# Patient Record
Sex: Female | Born: 1965 | Race: White | Hispanic: No | Marital: Married | Smoking: Former smoker
Health system: Southern US, Community
[De-identification: ages and names within clinical notes are randomized; demographics above are authoritative.]

## PROBLEM LIST (undated history)

## (undated) DIAGNOSIS — N8300 Follicular cyst of ovary, unspecified side: Secondary | ICD-10-CM

## (undated) DIAGNOSIS — E039 Hypothyroidism, unspecified: Secondary | ICD-10-CM

## (undated) DIAGNOSIS — Z860100 Personal history of colon polyps, unspecified: Secondary | ICD-10-CM

## (undated) DIAGNOSIS — E119 Type 2 diabetes mellitus without complications: Secondary | ICD-10-CM

## (undated) DIAGNOSIS — F419 Anxiety disorder, unspecified: Secondary | ICD-10-CM

## (undated) DIAGNOSIS — K222 Esophageal obstruction: Secondary | ICD-10-CM

## (undated) DIAGNOSIS — K602 Anal fissure, unspecified: Secondary | ICD-10-CM

## (undated) DIAGNOSIS — Z9889 Other specified postprocedural states: Secondary | ICD-10-CM

## (undated) DIAGNOSIS — E669 Obesity, unspecified: Secondary | ICD-10-CM

## (undated) DIAGNOSIS — K209 Esophagitis, unspecified without bleeding: Secondary | ICD-10-CM

## (undated) DIAGNOSIS — Z800 Family history of malignant neoplasm of digestive organs: Secondary | ICD-10-CM

## (undated) DIAGNOSIS — T7840XA Allergy, unspecified, initial encounter: Secondary | ICD-10-CM

## (undated) DIAGNOSIS — R1013 Epigastric pain: Secondary | ICD-10-CM

## (undated) DIAGNOSIS — K760 Fatty (change of) liver, not elsewhere classified: Secondary | ICD-10-CM

## (undated) DIAGNOSIS — K219 Gastro-esophageal reflux disease without esophagitis: Secondary | ICD-10-CM

## (undated) DIAGNOSIS — K2990 Gastroduodenitis, unspecified, without bleeding: Secondary | ICD-10-CM

## (undated) DIAGNOSIS — K589 Irritable bowel syndrome without diarrhea: Secondary | ICD-10-CM

## (undated) DIAGNOSIS — K2970 Gastritis, unspecified, without bleeding: Secondary | ICD-10-CM

## (undated) DIAGNOSIS — R112 Nausea with vomiting, unspecified: Secondary | ICD-10-CM

## (undated) DIAGNOSIS — D649 Anemia, unspecified: Secondary | ICD-10-CM

## (undated) DIAGNOSIS — E785 Hyperlipidemia, unspecified: Secondary | ICD-10-CM

## (undated) HISTORY — DX: Obesity, unspecified: E66.9

## (undated) HISTORY — DX: Irritable bowel syndrome, unspecified: K58.9

## (undated) HISTORY — DX: Anxiety disorder, unspecified: F41.9

## (undated) HISTORY — DX: Fatty (change of) liver, not elsewhere classified: K76.0

## (undated) HISTORY — PX: UPPER GASTROINTESTINAL ENDOSCOPY: SHX188

## (undated) HISTORY — DX: Personal history of colon polyps, unspecified: Z86.0100

## (undated) HISTORY — DX: Personal history of colonic polyps: Z86.010

## (undated) HISTORY — DX: Esophagitis, unspecified without bleeding: K20.90

## (undated) HISTORY — DX: Type 2 diabetes mellitus without complications: E11.9

## (undated) HISTORY — DX: Anal fissure, unspecified: K60.2

## (undated) HISTORY — DX: Anemia, unspecified: D64.9

## (undated) HISTORY — DX: Hypothyroidism, unspecified: E03.9

## (undated) HISTORY — DX: Gastro-esophageal reflux disease without esophagitis: K21.9

## (undated) HISTORY — DX: Gastritis, unspecified, without bleeding: K29.70

## (undated) HISTORY — DX: Follicular cyst of ovary, unspecified side: N83.00

## (undated) HISTORY — DX: Family history of malignant neoplasm of digestive organs: Z80.0

## (undated) HISTORY — PX: ABDOMINAL HYSTERECTOMY: SHX81

## (undated) HISTORY — DX: Esophageal obstruction: K22.2

## (undated) HISTORY — DX: Allergy, unspecified, initial encounter: T78.40XA

## (undated) HISTORY — DX: Gastroduodenitis, unspecified, without bleeding: K29.90

## (undated) HISTORY — DX: Hyperlipidemia, unspecified: E78.5

## (undated) HISTORY — DX: Epigastric pain: R10.13

## (undated) HISTORY — DX: Esophagitis, unspecified: K20.9

## (undated) HISTORY — PX: COLONOSCOPY: SHX174

---

## 1992-10-06 HISTORY — PX: TONSILLECTOMY: SUR1361

## 1998-12-05 ENCOUNTER — Other Ambulatory Visit: Admission: RE | Admit: 1998-12-05 | Discharge: 1998-12-05 | Payer: Self-pay | Admitting: Obstetrics and Gynecology

## 1999-01-02 ENCOUNTER — Ambulatory Visit (HOSPITAL_COMMUNITY): Admission: RE | Admit: 1999-01-02 | Discharge: 1999-01-02 | Payer: Self-pay | Admitting: Obstetrics and Gynecology

## 1999-03-09 ENCOUNTER — Ambulatory Visit (HOSPITAL_COMMUNITY): Admission: RE | Admit: 1999-03-09 | Discharge: 1999-03-09 | Payer: Self-pay | Admitting: Obstetrics and Gynecology

## 2000-02-05 ENCOUNTER — Other Ambulatory Visit: Admission: RE | Admit: 2000-02-05 | Discharge: 2000-02-05 | Payer: Self-pay | Admitting: *Deleted

## 2000-04-10 ENCOUNTER — Other Ambulatory Visit: Admission: RE | Admit: 2000-04-10 | Discharge: 2000-04-10 | Payer: Self-pay | Admitting: *Deleted

## 2000-04-10 ENCOUNTER — Encounter (INDEPENDENT_AMBULATORY_CARE_PROVIDER_SITE_OTHER): Payer: Self-pay | Admitting: Specialist

## 2000-10-06 HISTORY — PX: OVARIAN CYST REMOVAL: SHX89

## 2001-02-24 ENCOUNTER — Encounter (INDEPENDENT_AMBULATORY_CARE_PROVIDER_SITE_OTHER): Payer: Self-pay | Admitting: Specialist

## 2001-02-24 ENCOUNTER — Encounter: Payer: Self-pay | Admitting: *Deleted

## 2001-02-24 ENCOUNTER — Observation Stay (HOSPITAL_COMMUNITY): Admission: RE | Admit: 2001-02-24 | Discharge: 2001-02-25 | Payer: Self-pay | Admitting: *Deleted

## 2002-11-23 ENCOUNTER — Other Ambulatory Visit: Admission: RE | Admit: 2002-11-23 | Discharge: 2002-11-23 | Payer: Self-pay | Admitting: *Deleted

## 2003-12-26 ENCOUNTER — Other Ambulatory Visit: Admission: RE | Admit: 2003-12-26 | Discharge: 2003-12-26 | Payer: Self-pay | Admitting: *Deleted

## 2004-05-30 ENCOUNTER — Emergency Department (HOSPITAL_COMMUNITY): Admission: EM | Admit: 2004-05-30 | Discharge: 2004-05-30 | Payer: Self-pay | Admitting: Emergency Medicine

## 2005-03-17 ENCOUNTER — Other Ambulatory Visit: Admission: RE | Admit: 2005-03-17 | Discharge: 2005-03-17 | Payer: Self-pay | Admitting: *Deleted

## 2006-07-11 ENCOUNTER — Emergency Department (HOSPITAL_COMMUNITY): Admission: EM | Admit: 2006-07-11 | Discharge: 2006-07-11 | Payer: Self-pay | Admitting: Emergency Medicine

## 2006-08-10 ENCOUNTER — Other Ambulatory Visit: Admission: RE | Admit: 2006-08-10 | Discharge: 2006-08-10 | Payer: Self-pay | Admitting: *Deleted

## 2007-01-05 ENCOUNTER — Ambulatory Visit: Payer: Self-pay | Admitting: Gastroenterology

## 2007-01-18 ENCOUNTER — Ambulatory Visit: Payer: Self-pay | Admitting: Gastroenterology

## 2007-08-18 ENCOUNTER — Other Ambulatory Visit: Admission: RE | Admit: 2007-08-18 | Discharge: 2007-08-18 | Payer: Self-pay | Admitting: Gynecology

## 2008-02-06 ENCOUNTER — Emergency Department (HOSPITAL_COMMUNITY): Admission: EM | Admit: 2008-02-06 | Discharge: 2008-02-06 | Payer: Self-pay | Admitting: Emergency Medicine

## 2008-07-26 ENCOUNTER — Ambulatory Visit (HOSPITAL_COMMUNITY): Admission: RE | Admit: 2008-07-26 | Discharge: 2008-07-26 | Payer: Self-pay | Admitting: Family Medicine

## 2008-08-03 ENCOUNTER — Encounter (INDEPENDENT_AMBULATORY_CARE_PROVIDER_SITE_OTHER): Payer: Self-pay | Admitting: *Deleted

## 2008-11-09 DIAGNOSIS — E039 Hypothyroidism, unspecified: Secondary | ICD-10-CM | POA: Insufficient documentation

## 2008-11-09 DIAGNOSIS — K7689 Other specified diseases of liver: Secondary | ICD-10-CM | POA: Insufficient documentation

## 2008-11-09 DIAGNOSIS — K589 Irritable bowel syndrome without diarrhea: Secondary | ICD-10-CM | POA: Insufficient documentation

## 2008-11-09 DIAGNOSIS — N83209 Unspecified ovarian cyst, unspecified side: Secondary | ICD-10-CM | POA: Insufficient documentation

## 2008-11-09 DIAGNOSIS — Z860100 Personal history of colon polyps, unspecified: Secondary | ICD-10-CM | POA: Insufficient documentation

## 2008-11-09 DIAGNOSIS — K649 Unspecified hemorrhoids: Secondary | ICD-10-CM | POA: Insufficient documentation

## 2008-11-09 DIAGNOSIS — K219 Gastro-esophageal reflux disease without esophagitis: Secondary | ICD-10-CM | POA: Insufficient documentation

## 2008-12-07 ENCOUNTER — Ambulatory Visit: Payer: Self-pay | Admitting: Gastroenterology

## 2008-12-07 DIAGNOSIS — E669 Obesity, unspecified: Secondary | ICD-10-CM | POA: Insufficient documentation

## 2008-12-07 DIAGNOSIS — R1013 Epigastric pain: Secondary | ICD-10-CM | POA: Insufficient documentation

## 2008-12-08 ENCOUNTER — Ambulatory Visit: Payer: Self-pay | Admitting: Gastroenterology

## 2008-12-08 DIAGNOSIS — K2970 Gastritis, unspecified, without bleeding: Secondary | ICD-10-CM | POA: Insufficient documentation

## 2008-12-08 DIAGNOSIS — K2990 Gastroduodenitis, unspecified, without bleeding: Secondary | ICD-10-CM

## 2008-12-14 ENCOUNTER — Telehealth: Payer: Self-pay | Admitting: Gastroenterology

## 2009-01-22 DIAGNOSIS — K209 Esophagitis, unspecified without bleeding: Secondary | ICD-10-CM | POA: Insufficient documentation

## 2009-01-23 ENCOUNTER — Ambulatory Visit: Payer: Self-pay | Admitting: Gastroenterology

## 2009-01-23 LAB — CONVERTED CEMR LAB
ALT: 49 units/L — ABNORMAL HIGH (ref 0–35)
AST: 48 units/L — ABNORMAL HIGH (ref 0–37)
Ceruloplasmin: 38 mg/dL (ref 21–63)
HCV Ab: NEGATIVE
HDL: 33.2 mg/dL — ABNORMAL LOW (ref >39.00)
Hepatitis B Surface Ag: NEGATIVE
INR: 1 (ref 0.8–1.0)
Prothrombin Time: 10.8 s — ABNORMAL LOW (ref 10.9–13.3)
Total Bilirubin: 0.7 mg/dL (ref 0.3–1.2)
Total Protein: 7.8 g/dL (ref 6.0–8.3)
Triglycerides: 108 mg/dL (ref 0.0–149.0)

## 2009-01-31 ENCOUNTER — Telehealth: Payer: Self-pay | Admitting: Gastroenterology

## 2009-02-22 ENCOUNTER — Telehealth: Payer: Self-pay | Admitting: Gastroenterology

## 2009-02-22 ENCOUNTER — Encounter: Admission: RE | Admit: 2009-02-22 | Discharge: 2009-02-22 | Payer: Self-pay | Admitting: Gastroenterology

## 2009-02-22 ENCOUNTER — Encounter: Payer: Self-pay | Admitting: Gastroenterology

## 2009-03-06 ENCOUNTER — Ambulatory Visit: Payer: Self-pay | Admitting: Gastroenterology

## 2009-03-06 DIAGNOSIS — M2550 Pain in unspecified joint: Secondary | ICD-10-CM | POA: Insufficient documentation

## 2009-03-07 LAB — CONVERTED CEMR LAB
ALT: 54 units/L — ABNORMAL HIGH (ref 0–35)
Alkaline Phosphatase: 73 units/L (ref 39–117)
Total Bilirubin: 1 mg/dL (ref 0.3–1.2)

## 2009-06-01 ENCOUNTER — Telehealth (INDEPENDENT_AMBULATORY_CARE_PROVIDER_SITE_OTHER): Payer: Self-pay | Admitting: *Deleted

## 2009-06-04 ENCOUNTER — Ambulatory Visit: Payer: Self-pay | Admitting: Gastroenterology

## 2009-06-04 LAB — CONVERTED CEMR LAB: ALT: 43 units/L — ABNORMAL HIGH (ref 0–35)

## 2009-06-05 ENCOUNTER — Ambulatory Visit: Payer: Self-pay | Admitting: Gastroenterology

## 2009-09-05 ENCOUNTER — Encounter: Payer: Self-pay | Admitting: Gastroenterology

## 2009-09-25 ENCOUNTER — Telehealth: Payer: Self-pay | Admitting: Gastroenterology

## 2009-09-25 ENCOUNTER — Ambulatory Visit: Payer: Self-pay | Admitting: Gastroenterology

## 2009-10-01 LAB — CONVERTED CEMR LAB
ALT: 54 units/L — ABNORMAL HIGH (ref 0–35)
AST: 52 units/L — ABNORMAL HIGH (ref 0–37)
Bilirubin, Direct: 0.1 mg/dL (ref 0.0–0.3)
Total Protein: 7.2 g/dL (ref 6.0–8.3)

## 2009-10-02 ENCOUNTER — Ambulatory Visit: Payer: Self-pay | Admitting: Gastroenterology

## 2009-10-06 HISTORY — PX: CHOLECYSTECTOMY: SHX55

## 2009-10-09 ENCOUNTER — Ambulatory Visit: Payer: Self-pay | Admitting: Cardiology

## 2009-10-12 DIAGNOSIS — K439 Ventral hernia without obstruction or gangrene: Secondary | ICD-10-CM | POA: Insufficient documentation

## 2009-10-22 ENCOUNTER — Telehealth: Payer: Self-pay | Admitting: Gastroenterology

## 2009-11-05 ENCOUNTER — Ambulatory Visit (HOSPITAL_COMMUNITY): Admission: RE | Admit: 2009-11-05 | Discharge: 2009-11-05 | Payer: Self-pay | Admitting: General Surgery

## 2010-01-18 ENCOUNTER — Ambulatory Visit (HOSPITAL_COMMUNITY): Admission: RE | Admit: 2010-01-18 | Discharge: 2010-01-18 | Payer: Self-pay | Admitting: General Surgery

## 2010-01-18 ENCOUNTER — Encounter (INDEPENDENT_AMBULATORY_CARE_PROVIDER_SITE_OTHER): Payer: Self-pay | Admitting: *Deleted

## 2010-02-05 ENCOUNTER — Encounter: Payer: Self-pay | Admitting: Gastroenterology

## 2010-09-06 ENCOUNTER — Encounter: Payer: Self-pay | Admitting: Gastroenterology

## 2010-09-06 ENCOUNTER — Ambulatory Visit (HOSPITAL_COMMUNITY): Admission: RE | Admit: 2010-09-06 | Discharge: 2010-09-06 | Payer: Self-pay | Admitting: Family Medicine

## 2010-09-12 ENCOUNTER — Ambulatory Visit: Payer: Self-pay | Admitting: Gastroenterology

## 2010-10-27 ENCOUNTER — Encounter: Payer: Self-pay | Admitting: Endocrinology

## 2010-11-05 NOTE — Letter (Signed)
Summary: [facility]   Imported By: Sherian Rein 05/24/2010 10:35:20  _____________________________________________________________________  External Attachment:    Type:   Image     Comment:   External Document

## 2010-11-05 NOTE — Op Note (Signed)
Summary: Laparoscopic cholecystectomy & Ventral hernia repair, Pathology    NAME:  Lori Barber, Lori Barber              ACCOUNT NO.:  [id number]      MEDICAL RECORD NO.:  [id number]          PATIENT TYPE:  AMB      LOCATION:  DAY                          FACILITY:  [facility]      PHYSICIAN:  Ollen Gross. Vernell Morgans, M.D. DATE OF BIRTH:  1966-03-01      DATE OF PROCEDURE:  01/18/2010   DATE OF DISCHARGE:                                  OPERATIVE REPORT      PREOPERATIVE DIAGNOSIS:  Biliary dyskinesia.      POSTOPERATIVE DIAGNOSES:   1. Biliary dyskinesia.   2. Ventral hernia.      PROCEDURES:   1. Laparoscopic cholecystectomy with intraoperative cholangiogram.   2. Ventral hernia repair.      SURGEON:  Dr. Carolynne Edouard      ASSISTANT:  Dr. Michaell Cowing.      ANESTHESIA:  General endotracheal.      PROCEDURE:  After informed consent was obtained, the patient was brought   to the operating room, placed in supine position on the operating room   table.  After adequate induction of general anesthesia, the patient's   abdomen was prepped with Betadine and draped in usual sterile manner.   The area below the umbilicus was infiltrated with 0.25% Marcaine.  A   small incision was made with a 15-blade knife.  This incision was   carried down through the subcutaneous tissue bluntly with a hemostat and   Army-[employer]y retractors until the linea alba was identified.  The linea   alba was incised with a 15-blade knife and either side was grasped with   Kocher clamps and elevated anteriorly.  The preperitoneal space was then   probed bluntly with a hemostat until the peritoneum was opened and   access was gained to the abdominal cavity.  A 0 Vicryl pursestring   stitch was placed in the fascia around the opening.  A Hasson cannula   was placed through the opening and anchored in place with the previously   placed Vicryl pursestring stitch.  The abdomen was then insufflated with   carbon dioxide without difficulty.      The  patient was placed in reverse Trendelenburg position, rotated with   the right side up.  Next the epigastric region was infiltrated with   0.25% Marcaine.  A small incision was made with a 15-blade knife and a   10-mm port was placed bluntly through this incision into the abdominal   cavity under direct vision without any difficulty.  Sites were then   chosen laterally on the right side of the abdomen for placement of 5-mm   ports.  Each of these areas was infiltrated with 0.25% Marcaine.  Small   stab incisions were made with a 15-blade knife and 5-mm ports were   placed bluntly through these incisions into the abdominal cavity under   direct vision.      A blunt grasper was placed through the lateral-most 5-mm port, and used   to grasp the dome of  the gallbladder and elevate it anteriorly and   superiorly.  Another blunt grasper was placed through the other 5-mm   port and used to retract on the body and neck of the gallbladder.  A   dissector was placed through the epigastric port.  The peritoneal   reflection at the gallbladder neck was opened with the electrocautery.   Blunt dissection was then carried out in this area until the gallbladder   neck-cystic duct junction was readily identified and a good window was   created.  A single clip was placed on the gallbladder neck.  A small   ductotomy was made just below the clip with the laparoscopic scissors.   A 14 gauge Angiocath was placed percutaneously through the anterior   abdominal wall under direct vision.  A Reddick cholangiogram catheter   was placed through the Angiocath and flushed.  The Reddick catheter was   then placed within the cystic duct and anchored in place with a clip and   a cholangiogram was obtained that showed no filling defects, good   emptying into the duodenum and adequate length on the cystic duct.      The anchoring clip and catheter were removed from the patient.  Three   clips placed proximally on the  cystic duct and the duct was divided   between the 2 sets of clips.  Posterior to this, the cystic artery was   identified and again dissected bluntly in circumferential manner until a   good window was created.  Two clips were placed proximally and one   distally on the artery and the artery was divided between the two.  Next   a laparoscopic hook cautery device was used to separate the gallbladder   from the liver bed.  Prior to completely detaching the gallbladder from   the liver bed, the liver bed was inspected and several small bleeding   points were coagulated with electrocautery until the area was completely   hemostatic.  The gallbladder was then detached the rest of the way from   the liver bed without difficulty.  A laparoscopic bag was inserted   through the epigastric port.  The gallbladder was placed within the bag   and the bag was sealed.  The abdomen was then irrigated with copious   amounts of saline until the effluent was clear.  The laparoscope was   then moved to the epigastric port.  A gallbladder grasper was placed   through the Hasson cannula and used to grasp the upper end of the bag.   The bag with the gallbladder was removed with the Hasson cannula through   the infraumbilical port without difficulty.  The fascial defect was   closed with the previously-placed Vicryl pursestring stitch as well as   another figure-of-eight 0 Vicryl stitch.  There was a small ventral   hernia just below this closure that we were able to close with more   figure-of-eight and interrupted 0 Vicryl stitches through the same   incision.      Once this was accomplished, the liver bed was inspected again and   appeared to be hemostatic.  The ports were then removed under direct   vision.  The gas was allowed to escape.  The skin incisions were all   closed with interrupted 4-0 Monocryl subcuticular stitches.  Dermabond   dressings were then applied.  The patient tolerated the procedure  well.   At the end of the case all  needle, sponge and instrument counts were   correct.  The patient was then awakened and taken to the recovery in   stable condition.               Ollen Gross. Vernell Morgans, M.D.            PST/MEDQ  D:  01/18/2010  T:  01/18/2010  Job:  [id number]      Electronically Signed by Chevis Pretty III M.D. on 01/29/2010 05:31:38 PM     SP-Surgical Pathology - STATUS: Final  .                                         Perform Date: 15Apr11 00:01  Ordered By: Hoy Finlay MD , PROVIDER         Ordered Date:  Facility: [facility]                              Department: [facility]  Service Report Text  [facility]   [address], [address]   [place], [place] [address]   Telephone [phone] or [phone] Fax [phone]    REPORT OF SURGICAL PATHOLOGY    Case #: [id number]   Patient Name: Lori Barber, Lori Barber   Office Chart Number: [id number]    MRN: [id number]   Pathologist: Colonel Bald M.D., Ivin Booty   DOB/Age 03-03-1966 (Age: 10) Gender: F   Date Taken: 01/18/2010   Date Received: 01/18/2010    FINAL DIAGNOSIS   ***Microscopic Examination and Diagnosis***    1. GALLBLADDER, : - MILD CHRONIC CHOLECYSTITIS.   - there is no evidence of malignancy.    DATE REPORTED: 01/22/2010   *** Electronically Signed Out by Colonel Bald M.D., Ivin Booty, Pathologist, Electronic Signature ***    CLINICAL HISTORY    SPECIMEN(S) OBTAINED   1. Gallbladder,    SPECIMEN COMMENTS:   1. Biliary dyskinesia. (Lw)    Gross Description   1. Size/?Intact: 6 cm in length x 3.5 cm across the fundus, intact   Serosal surface: Greenish yellow, smooth and glistening.   Mucosa/Wall: The wall thickness measures up to 0.3 cm. The mucosa is pale tan   and velvety.   Contents: The gallbladder contains an abundant amount of brownish green, thick   viscus bile. No calculi are PRESENT in either the gallbladder or specimen   container.   Cystic duct: The cystic duct measures 0.3 cm in length x 0.2 cm in  diameter.   Block summary: Representative sections are submitted in a single block.   (Jc/Gp:Mz 01/18/10)    MICROSCOPIC DESCRIPTION    CASE COMMENTS   Additional Information  HL7 RESULT STATUS : F  External IF Update Timestamp : 2010-01-22:18:00:00.000000

## 2010-11-05 NOTE — Progress Notes (Signed)
Summary: rx refill   Phone Note Call from Patient Call back at Work Phone [phone]   Caller: Patient Call For: Jarold Motto Reason for Call: Refill Medication, Talk to Nurse Summary of Call: Patient needs refills for her Nexium called in to [facility] Initial call taken by: Tawni Levy,  October 22, 2009 10:39 AM    Prescriptions: NEXIUM 40 MG CPDR (ESOMEPRAZOLE MAGNESIUM) TAke 1 tablet daily 30 minutes before meals  #30 x 11   Entered by:   Ashok Cordia RN   Authorized by:   Mardella Layman MD [facility]   Signed by:   Ashok Cordia RN on 10/23/2009   Method used:   Electronically to        [facility] (retail)       [address] [place], [place]  [address]       Ph: [phone]       Fax: [phone]   RxID:   [id number]

## 2010-12-24 LAB — GLUCOSE, CAPILLARY
Glucose-Capillary: 105 mg/dL — ABNORMAL HIGH (ref 70–99)
Glucose-Capillary: 106 mg/dL — ABNORMAL HIGH (ref 70–99)

## 2010-12-25 LAB — COMPREHENSIVE METABOLIC PANEL
ALT: 49 U/L — ABNORMAL HIGH (ref 0–35)
Alkaline Phosphatase: 75 U/L (ref 39–117)
GFR calc Af Amer: >60 mL/min (ref >60)
Glucose, Bld: 113 mg/dL — ABNORMAL HIGH (ref 70–99)
Potassium: 3.9 mEq/L (ref 3.5–5.1)
Sodium: 139 mEq/L (ref 135–145)
Total Bilirubin: 0.7 mg/dL (ref 0.3–1.2)

## 2010-12-25 LAB — DIFFERENTIAL
Lymphocytes Relative: 27 % (ref 12–46)
Lymphs Abs: 2.2 10*3/uL (ref 0.7–4.0)
Monocytes Absolute: 0.5 10*3/uL (ref 0.1–1.0)
Monocytes Relative: 7 % (ref 3–12)
Neutrophils Relative %: 63 % (ref 43–77)

## 2010-12-25 LAB — CBC
HCT: 37.7 % (ref 36.0–46.0)
Hemoglobin: 12.9 g/dL (ref 12.0–15.0)
MCV: 91.5 fL (ref 78.0–100.0)
RBC: 4.12 MIL/uL (ref 3.87–5.11)

## 2010-12-30 ENCOUNTER — Other Ambulatory Visit: Payer: Self-pay | Admitting: Gynecology

## 2011-01-16 LAB — GLUCOSE, CAPILLARY
Glucose-Capillary: 85 mg/dL (ref 70–99)
Glucose-Capillary: 89 mg/dL (ref 70–99)

## 2011-02-21 NOTE — Discharge Summary (Signed)
[facility]  Patient:    Lori Barber, Lori Barber                     MRN: [id number] Adm. Date:  [id number] Disc. Date: [id number] Attending:  Collene Schlichter                           Discharge Summary  HISTORY:  The patient is a 45 year old with abnormal uterine bleeding, superior pelvic pain, history of adenomyosis, and endometriosis for hysterectomy on May 22.  The remainder of her history and physical are as previously dictated.  Preoperative hemoglobin 11.2, chest x-ray normal. Electrocardiogram normal.  HOSPITAL COURSE:  The patient was taken to the operating room on May 22, at which time an abdominal supracervical hysterectomy and lysis of adhesions were performed.  The patient did well postoperatively.  Diet and ambulation were progressed over the evening of May 22 and early morning of Feb 25, 2001.  On the morning of May 23, she was afebrile and experiencing no problems except pain which was controlled by medication, and it was felt that she could be discharged at this time.  FINAL DIAGNOSES: 1. Pelvic pain probably secondary to uterine lesions. 2. Question adenomyosis. 3. Abnormal uterine bleeding. 4. Pelvic adhesions.  OPERATION:  Abdominal supracervical hysterectomy, lysis of adhesions. Pathology report unavailable at the time of dictation.  DISPOSITION:  Discharged home to return to the office in two weeks for follow-up.  She was instructed to gradually progress her activities over several weeks at home and eliminate lifting and driving for two weeks.  She was fully ambulatory, on a regular diet, and in good condition at the time of discharge.  DISCHARGE MEDICATIONS: 1. She was given a prescription for [facility] #30 to be taken 1-2 q.4h.    p.r.n. pain. 2. Doxycycline 100 mg #10 to be taken 1 b.i.d.  She will call for any problems. DD:  02/25/01 TD:  02/25/01 Job: [id number]

## 2011-02-21 NOTE — H&P (Signed)
[facility]  Patient:    Lori Barber, Lori Barber                        MRN: [id number] Adm. Date:  02/24/01 Attending:  Almedia Balls. Randell Patient, M.D.                         History and Physical  CHIEF COMPLAINT:  Pain, abnormal uterine bleeding.  HISTORY OF PRESENT ILLNESS:  The patient is a 45 year old gravida 3, para 1, whose last menstrual period was in late April 2002.  She has had progressively severe pain with menses and with intercourse over the past several years.  She also has had an increase in bleeding to the point where she underwent hysteroscopy, D&C in July 2001 with benign findings only.  She had laparoscopy at that time as well and ablation of bilaterally ovarian cysts with endometriosis and lysis of adhesions.  She has had progressive worsening of her problems to the point where she has to remain in bed at times, and has been treated with various analgesics to include propoxyphene and naproxen sodium without success.  She also has been tried on birth control pills without improvement.  She is admitted at this time for laparoscopically assisted vaginal hysterectomy, possible transabdominal hysterectomy, possible bilateral salpingo-oophorectomy.  She has been fully counseled as to the nature of the procedure and the risks involved include risks of anesthesia, injury to bowel or bladder, blood vessels, ureters, postoperative hemorrhage, infection, and recuperation.  She fully understands all these considerations and wishes to proceed on Feb 24, 2001.  Pap smear has been within normal limits in the past year.  PAST MEDICAL HISTORY:  History of tonsillectomy in 1995, C-section in 1999, hysteroscopy in 2000 and 2001.  She has had irritable bowel syndrome for many years and is hypothyroid, taking 100 mcg of Levothroid daily.  She has been placed on Celexa as well in the past.  ALLERGIES:  PENICILLIN and CLEOCIN.  FAMILY HISTORY:  Mother with breast  cancer.  Grandmother and grandfather with cardiovascular disease.  SOCIAL HISTORY:  The patient smokes approximately 1/4 to 1/2 pack of cigarettes per day.  REVIEW OF SYSTEMS:  HEENT:  Negative.  CARDDIORESPIRATORY:  Negative. GASTROINTESTINAL:  As noted above.  GENITOURINARY:  As noted above. NEUROMUSCULAR:  Negative.  PHYSICAL EXAMINATION:  VITAL SIGNS:  Height 5 feet 4-3/4 inches, weight 204 pounds, blood pressure 110/60, pulse 76, respirations 18.  GENERAL:  Well-developed white female in no acute distress.  HEENT:  Within normal limits.  NECK:  Supple without masses, adenopathy, or bruits.  HEART:  Regular rate and rhythm without murmurs.  LUNGS:  Clear to auscultation and percussion.  BREASTS:  Sitting and lying without mass.  Axilla negative.  ABDOMEN:  Flat and soft without mass.  Nontender.  PELVIC:  External genitalia, Bartholin, urethra, and Skene glands within normal limits.  Cervix is slightly inflamed.  Uterus is mid-posterior, top, normal size, soft, and tender.  Adnexal exam reveals tenderness bilaterally and some fullness, left greater than right.  Anterior and posterior cul-de-sac exam is confirmatory.  EXTREMITIES:  Within normal limits.  NEUROLOGIC:  Central nervous system grossly intact.  SKIN:  Without suspicious lesions.  IMPRESSION:  Abnormal uterine bleeding, history of endometriosis, history of adhesions, dyspareunia.  DISPOSITION:  As noted above. DD:  02/22/01 TD:  02/23/01 Job: [id number]

## 2011-02-21 NOTE — Op Note (Signed)
[facility]  Patient:    Lori Barber, Lori Barber                     MRN: [id number] Proc. Date: 02/24/01 Adm. Date:  [id number] Attending:  Collene Schlichter CC:         Harl Bowie, M.D.   Operative Report  PREOPERATIVE DIAGNOSES: 1. Abnormal uterine bleeding. 2. Pelvic pain. 3. History of endometriosis. 4. Pelvic adhesions.  POSTOPERATIVE DIAGNOSES: 1. Abnormal uterine bleeding. 2. Pelvic pain. 3. History of endometriosis. 4. Pelvic adhesions. 5. Pending pathology.  OPERATION: 1. Abdominal supracervical hysterectomy. 2. Lysis of adhesions.  SURGEON:  Almedia Balls. Randell Patient, M.D.  FIRST ASSISTANT:  Harl Bowie, M.D.  ANESTHESIA:  General orotracheal.  INDICATIONS:  The patient is a 45 year old with the above-noted problems.  She was counselled as to the need for surgery to treat these problems.  She was fully counselled as to the nature of the procedure and the risks involved to include risks of anesthesia, injury to bowel, bladder, blood vessels, ureters, postoperative hemorrhage, infection, and recuperation as well as possible hormone replacement should her ovaries be removed.  She fully understands all of these considerations and wishes to proceed on 24 Feb 2001.  OPERATIVE FINDINGS:  On entry into the abdomen, there were dense adhesions involving the fascial layer.  There was a defect in the peritoneum in the upper portion near the umbilicus where a portion of omentum had prolapsed through without strangulation.  The uterus was quite adherent to the anterior pelvic peritoneum over the bladder probably secondary to previous cesarean section.  Upper abdominal viscera were normal to palpation to include liver, lower gallbladder area, spleen, kidneys, periaortic areas, and appendix.  DESCRIPTION OF PROCEDURE:     With the patient under general anesthesia, prepared and draped in the usual sterile fashion, in the dorsolithotomy position, a  speculum was placed in the vagina.  The anterior lip of the cervix was grasped with a single-tooth tenaculum with an attempt to reflect the descent of the uterus which was unsuccessful.  Accordingly, the patient was repositioned after a Foley catheter had been placed and left to straight drainage.  A lower abdominal transverse incision was made, after excising a previous scar, and carried into the posterior fascial area without difficulty. The area of omentum extending up through the peritoneum was encountered, and, using careful dissection, the peritoneum was opened with freeing of this strangulated loop of omentum.   A self-retaining retractor was then placed, and the bowel was packed off.  Kelly clamps were used to clamp the uterine ovarian anastomoses, tubes and round ligaments bilaterally for traction and hemostasis.  Bovie electrocoagulation was used for transsection of the round ligaments bilaterally with entry into the retroperitoneal space and identification of the utero-ovarian anastomosis.  These were then clamped bilaterally, cut, and doubly ligated with #1 chromic catgut.  Bladder flap was developed carefully on the anterior surface of the uterus because of the dense adhesions in this area.  The uterine vessels were then skeletonized, clamped, cut, and suture ligated with #1 chronic catgut bilaterally   Cardinal ligaments were treated in a similar fashion.  At this point, it was then possible to excise the uterine fundus which was accomplished using Bovie electrocoagulation.  Interrupted figure-of-eight sutures were necessary in the cervical angles for hemostasis.  The cervix was then reapproximated and rendered hemostatic with interrupted horizontal mattress sutures in the central area.  The area was lavaged with copious  amounts of lactated Ringers solution.  After noting that hemostasis was maintained, the area was reperitonealized with interrupted sutures of #1 chromic  catgut.  The adnexal structures were then suspended from the round ligaments using interrupted sutures of #1 chromic catgut.  The area was lavaged with copious amounts of lactated Ringers, and after noting that hemostasis was maintained and that sponge and instrument counts were correct, the peritoneum was closed with a continuous suture of #0 Vicryl.  Fascia was closed with two sutures of #0 Vicryl which were brought from the lateral aspects of the incision and tied separately in the midline.  Subcutaneous fat was reapproximated with interrupted sutures of #1 chromic catgut.  Skin was closed with a subcuticular suture of 3-0 plain catgut.  Then 10 ml of 0.5 Marcaine solution with 1:200,000 epinephrine were injected under the incision for postoperative analgesia.  Estimated blood loss 150 ml.  The patient was taken to the recovery room in good condition with clear urine in the Foley catheter tubing. She will be placed on 23-hour observation following surgery. DD:  02/24/01 TD:  02/24/01 Job: [id number]

## 2011-02-27 ENCOUNTER — Emergency Department (HOSPITAL_COMMUNITY)
Admission: EM | Admit: 2011-02-27 | Discharge: 2011-02-27 | Disposition: A | Discharge status: Home / Self Care | Payer: 59 | Attending: Emergency Medicine | Admitting: Emergency Medicine

## 2011-02-27 ENCOUNTER — Emergency Department (HOSPITAL_COMMUNITY): Payer: 59

## 2011-02-27 DIAGNOSIS — R059 Cough, unspecified: Secondary | ICD-10-CM | POA: Insufficient documentation

## 2011-02-27 DIAGNOSIS — E039 Hypothyroidism, unspecified: Secondary | ICD-10-CM | POA: Insufficient documentation

## 2011-02-27 DIAGNOSIS — R071 Chest pain on breathing: Secondary | ICD-10-CM | POA: Insufficient documentation

## 2011-02-27 DIAGNOSIS — K219 Gastro-esophageal reflux disease without esophagitis: Secondary | ICD-10-CM | POA: Insufficient documentation

## 2011-02-27 DIAGNOSIS — R05 Cough: Secondary | ICD-10-CM | POA: Insufficient documentation

## 2011-02-27 DIAGNOSIS — E119 Type 2 diabetes mellitus without complications: Secondary | ICD-10-CM | POA: Insufficient documentation

## 2011-02-27 LAB — BASIC METABOLIC PANEL
CO2: 26 mEq/L (ref 19–32)
GFR calc Af Amer: >60 mL/min (ref >60)
Glucose, Bld: 118 mg/dL — ABNORMAL HIGH (ref 70–99)
Potassium: 3.8 mEq/L (ref 3.5–5.1)
Sodium: 134 mEq/L — ABNORMAL LOW (ref 135–145)

## 2011-02-27 LAB — DIFFERENTIAL
Lymphocytes Relative: 24 % (ref 12–46)
Lymphs Abs: 2.6 10*3/uL (ref 0.7–4.0)
Monocytes Absolute: 0.8 10*3/uL (ref 0.1–1.0)
Neutro Abs: 6.8 10*3/uL (ref 1.7–7.7)

## 2011-02-27 LAB — CBC
RBC: 4.34 MIL/uL (ref 3.87–5.11)
RDW: 13.1 % (ref 11.5–15.5)

## 2011-02-27 LAB — D-DIMER, QUANTITATIVE: D-Dimer, Quant: 0.23 ug/mL-FEU (ref 0.00–0.48)

## 2011-05-15 ENCOUNTER — Other Ambulatory Visit: Payer: Self-pay | Admitting: Family Medicine

## 2011-05-15 DIAGNOSIS — M545 Low back pain, unspecified: Secondary | ICD-10-CM

## 2011-05-19 ENCOUNTER — Ambulatory Visit (HOSPITAL_COMMUNITY)
Admission: RE | Admit: 2011-05-19 | Discharge: 2011-05-19 | Disposition: A | Discharge status: Home / Self Care | Payer: 59 | Source: Ambulatory Visit | Attending: Family Medicine | Admitting: Family Medicine

## 2011-05-19 DIAGNOSIS — M545 Low back pain, unspecified: Secondary | ICD-10-CM

## 2011-05-19 DIAGNOSIS — M51379 Other intervertebral disc degeneration, lumbosacral region without mention of lumbar back pain or lower extremity pain: Secondary | ICD-10-CM | POA: Insufficient documentation

## 2011-05-19 DIAGNOSIS — M5137 Other intervertebral disc degeneration, lumbosacral region: Secondary | ICD-10-CM | POA: Insufficient documentation

## 2011-07-09 ENCOUNTER — Encounter: Payer: Self-pay | Admitting: *Deleted

## 2011-07-11 ENCOUNTER — Encounter: Payer: Self-pay | Admitting: Gastroenterology

## 2011-07-11 ENCOUNTER — Ambulatory Visit (INDEPENDENT_AMBULATORY_CARE_PROVIDER_SITE_OTHER): Payer: 59 | Admitting: Gastroenterology

## 2011-07-11 VITALS — BP 112/78 | Ht 65.0 in | Wt 241.0 lb

## 2011-07-11 DIAGNOSIS — K760 Fatty (change of) liver, not elsewhere classified: Secondary | ICD-10-CM

## 2011-07-11 DIAGNOSIS — N83209 Unspecified ovarian cyst, unspecified side: Secondary | ICD-10-CM

## 2011-07-11 DIAGNOSIS — K7689 Other specified diseases of liver: Secondary | ICD-10-CM

## 2011-07-11 DIAGNOSIS — K219 Gastro-esophageal reflux disease without esophagitis: Secondary | ICD-10-CM | POA: Insufficient documentation

## 2011-07-11 DIAGNOSIS — K589 Irritable bowel syndrome without diarrhea: Secondary | ICD-10-CM

## 2011-07-11 DIAGNOSIS — N83201 Unspecified ovarian cyst, right side: Secondary | ICD-10-CM

## 2011-07-11 DIAGNOSIS — Z9049 Acquired absence of other specified parts of digestive tract: Secondary | ICD-10-CM

## 2011-07-11 DIAGNOSIS — K439 Ventral hernia without obstruction or gangrene: Secondary | ICD-10-CM

## 2011-07-11 DIAGNOSIS — Z9089 Acquired absence of other organs: Secondary | ICD-10-CM

## 2011-07-11 MED ORDER — CILIDINIUM-CHLORDIAZEPOXIDE 2.5-5 MG PO CAPS: 1.0000 | ORAL_CAPSULE | Freq: Three times a day (TID) | ORAL | Status: DC

## 2011-07-11 NOTE — Progress Notes (Signed)
This is a nice 45 year old Caucasian female who has recurrent mid abdominal pain intermittently with a known ventral wall hernia. Status post laparoscopic cholecystectomy in April of 2011. She denies  current hepatobiliary complaints, but does have mild acid reflux managed with Nexium 40 mg a day. Because of her recurrent pain, she had recent CT scan of the abdomen which again has confirmed a ventral hernia with some mesenteric fat  herniation. She has classic IBS with alternating diarrhea and constipation, gas, bloating, all exacerbated by stress. She is up-to-date her endoscopy and colonoscopy exams. She denies systemic or hepatobiliary complaints. Her history is positive for recurrent ovarian cysts, and she is followed by Dr. Beather Arbour , has had recent gynecologic exam. She specifically denies melena, hematochezia, dysphagia, or rectal bleeding.  Current Medications, Allergies, Past Medical History, Past Surgical History, Family History and Social History were reviewed in [employer] record.  Pertinent Review of Systems Negative   Physical Exam: The appearing slightly obese female in no acute distress. I cannot appreciate stigmata of chronic liver disease. Chest is clear cardiac exam is unremarkable. I cannot appreciate hepatosplenomegaly, but she does have a ventral hernia in the midline below her umbilicus demonstrated by palpation and straight leg raising. Bowel sounds are normal. Mental status is normal her peripheral extremities are unremarkable.    Assessment and Plan: The GERD doing well daily Nexium. She has classic IBS and I have prescribed Librax one every 6-8 hours as needed for abdominal cramping. We have scheduled her to see Dr. Chevis Pretty surgery for consideration of her ventral hernia repair. Also recent CT scan from [facility] also requested. Reflux regime reviewed with the patient, and we discussed IBS glands. Encounter Diagnosis  Name Primary?  .  Ventral hernia Yes   Please copy her primary care physician, referring physician, and pertinent subspecialists.

## 2011-07-11 NOTE — Patient Instructions (Signed)
Appointment made for Dr. Carolynne Edouard at [facility] 07/28/11 9:40 Librax prescription sent to your pharmacy

## 2011-07-28 ENCOUNTER — Ambulatory Visit (INDEPENDENT_AMBULATORY_CARE_PROVIDER_SITE_OTHER): Payer: 59 | Admitting: General Surgery

## 2011-07-28 ENCOUNTER — Encounter (INDEPENDENT_AMBULATORY_CARE_PROVIDER_SITE_OTHER): Payer: Self-pay | Admitting: General Surgery

## 2011-07-28 VITALS — BP 128/82 | HR 70 | Temp 97.2°F | Resp 16 | Ht 65.0 in | Wt 245.8 lb

## 2011-07-28 DIAGNOSIS — K439 Ventral hernia without obstruction or gangrene: Secondary | ICD-10-CM

## 2011-07-28 NOTE — Progress Notes (Signed)
Subjective:     Patient ID: Lori Barber, female   DOB: 22-Apr-1966, 45 y.o.   MRN: [id number]  HPI The patient is a 45 year old white female who is now about a year and a half out from a laparoscopic cholecystectomy. At that time she had a small ventral hernia that was repaired primarily. This repair has since broke down. She has been having some abdominal discomfort for the last 6 or 8 months. She denies any nausea or vomiting. No fevers or chills. She did have a CT scan that confirmed a recurrence of the hernia. Her bowels are been normally.  Review of Systems  Constitutional: Negative.   HENT: Negative.   Eyes: Negative.   Respiratory: Negative.   Cardiovascular: Negative.   Gastrointestinal: Positive for abdominal pain.  Genitourinary: Negative.   Musculoskeletal: Negative.   Skin: Negative.   Neurological: Negative.   Hematological: Negative.   Psychiatric/Behavioral: Negative.    Past Medical History  Diagnosis Date  . Esophagitis, unspecified   . Unspecified gastritis and gastroduodenitis without mention of hemorrhage   . Obesity, unspecified   . Other chronic nonalcoholic liver disease   . Abdominal pain, epigastric   . Anal fissure   . Follicular cyst of ovary   . Unspecified hemorrhoids without mention of complication   . Irritable bowel syndrome   . Personal history of colonic polyps   . Family history of malignant neoplasm of gastrointestinal tract   . Other chronic nonalcoholic liver disease   . Unspecified hypothyroidism   . Type II or unspecified type diabetes mellitus without mention of complication, not stated as uncontrolled   . Esophageal reflux   . Hyperlipidemia   . Abdominal distention    Past Surgical History  Procedure Date  . Tonsillectomy   . Cesarean section   . Cholecystectomy 2011  . Tonsillectomy   . Abdominal hysterectomy   . Ovarian cyst removal    Current Outpatient Prescriptions  Medication Sig Dispense Refill  .  clidinium-chlordiazePOXIDE (LIBRAX) 2.5-5 MG per capsule Take 1 capsule by mouth 3 (three) times daily with meals.  90 capsule  6  . esomeprazole (NEXIUM) 40 MG capsule Take 40 mg by mouth daily before breakfast.        . levothyroxine (SYNTHROID, LEVOTHROID) 50 MCG tablet Take 50 mcg by mouth daily.         Allergies  Allergen Reactions  . Penicillins     REACTION: swelling, SOB  . Amoxicillin Nausea Only  . Clarithromycin Nausea And Vomiting       Objective:   Physical Exam  Constitutional: She is oriented to person, place, and time. She appears well-developed and well-nourished.  HENT:  Head: Normocephalic and atraumatic.  Eyes: Conjunctivae and EOM are normal. Pupils are equal, round, and reactive to light.  Neck: Normal range of motion. Neck supple.  Cardiovascular: Normal rate, regular rhythm and normal heart sounds.   Pulmonary/Chest: Effort normal and breath sounds normal.  Abdominal: Soft. Bowel sounds are normal.  Musculoskeletal: Normal range of motion.  Neurological: She is alert and oriented to person, place, and time.  Skin: Skin is warm and dry.  Psychiatric: She has a normal mood and affect. Her behavior is normal.       Assessment:     The patient has a recurrent ventral hernia centered around her umbilicus.    Plan:     Because of the risk of incarceration strangulation I think she would benefit from having the hernia fixed. She  would also like to have this done. I have discussed with her in detail the risks and benefits of the operation to fix the hernia as well as some of the technical aspects and she understands and wishes to proceed. I think she would be a good candidate for a laparoscopic type repair and she agrees. Her CT scans shows 2 small fascial defects near the umbilicus with omental fat. I reviewed her operative report from her laparoscopic cholecystectomy and it sounded like her abdomen was fairly clear of any adhesion.

## 2011-07-28 NOTE — Patient Instructions (Signed)
Plan for laparoscopic ventral hernia repair with mesh 

## 2011-08-12 ENCOUNTER — Other Ambulatory Visit (INDEPENDENT_AMBULATORY_CARE_PROVIDER_SITE_OTHER): Payer: Self-pay | Admitting: General Surgery

## 2011-09-04 ENCOUNTER — Encounter: Payer: Self-pay | Admitting: Gastroenterology

## 2011-09-04 ENCOUNTER — Other Ambulatory Visit (INDEPENDENT_AMBULATORY_CARE_PROVIDER_SITE_OTHER): Payer: 59

## 2011-09-04 ENCOUNTER — Ambulatory Visit (INDEPENDENT_AMBULATORY_CARE_PROVIDER_SITE_OTHER): Payer: 59 | Admitting: Gastroenterology

## 2011-09-04 VITALS — BP 112/72 | HR 80 | Ht 65.0 in | Wt 244.0 lb

## 2011-09-04 DIAGNOSIS — Z9089 Acquired absence of other organs: Secondary | ICD-10-CM

## 2011-09-04 DIAGNOSIS — T8189XA Other complications of procedures, not elsewhere classified, initial encounter: Secondary | ICD-10-CM

## 2011-09-04 DIAGNOSIS — E669 Obesity, unspecified: Secondary | ICD-10-CM

## 2011-09-04 DIAGNOSIS — Z9049 Acquired absence of other specified parts of digestive tract: Secondary | ICD-10-CM

## 2011-09-04 DIAGNOSIS — Z9889 Other specified postprocedural states: Secondary | ICD-10-CM | POA: Insufficient documentation

## 2011-09-04 DIAGNOSIS — R110 Nausea: Secondary | ICD-10-CM

## 2011-09-04 DIAGNOSIS — R197 Diarrhea, unspecified: Secondary | ICD-10-CM

## 2011-09-04 DIAGNOSIS — K469 Unspecified abdominal hernia without obstruction or gangrene: Secondary | ICD-10-CM | POA: Insufficient documentation

## 2011-09-04 DIAGNOSIS — Z8719 Personal history of other diseases of the digestive system: Secondary | ICD-10-CM

## 2011-09-04 LAB — FOLATE: Folate: 11.2 ng/mL (ref >5.9)

## 2011-09-04 LAB — CBC WITH DIFFERENTIAL/PLATELET
Basophils Absolute: 0 10*3/uL (ref 0.0–0.1)
Basophils Relative: 0.4 % (ref 0.0–3.0)
Eosinophils Absolute: 0.4 10*3/uL (ref 0.0–0.7)
Lymphocytes Relative: 24.7 % (ref 12.0–46.0)
MCHC: 33.8 g/dL (ref 30.0–36.0)
MCV: 91.6 fl (ref 78.0–100.0)
Monocytes Absolute: 0.7 10*3/uL (ref 0.1–1.0)
Neutrophils Relative %: 63.6 % (ref 43.0–77.0)
Platelets: 328 10*3/uL (ref 150.0–400.0)
RBC: 4.37 Mil/uL (ref 3.87–5.11)
RDW: 13.5 % (ref 11.5–14.6)
WBC: 9.3 10*3/uL (ref 4.5–10.5)

## 2011-09-04 LAB — BASIC METABOLIC PANEL
CO2: 28 mEq/L (ref 19–32)
Chloride: 105 mEq/L (ref 96–112)
Creatinine, Ser: 0.9 mg/dL (ref 0.4–1.2)
Glucose, Bld: 99 mg/dL (ref 70–99)
Sodium: 141 mEq/L (ref 135–145)

## 2011-09-04 LAB — HEPATIC FUNCTION PANEL: Albumin: 4 g/dL (ref 3.5–5.2)

## 2011-09-04 LAB — IBC PANEL: Saturation Ratios: 17.9 % — ABNORMAL LOW (ref 20.0–50.0)

## 2011-09-04 LAB — FERRITIN: Ferritin: 153.5 ng/mL (ref 10.0–291.0)

## 2011-09-04 LAB — VITAMIN B12: Vitamin B-12: 405 pg/mL (ref 211–911)

## 2011-09-04 MED ORDER — DEXLANSOPRAZOLE 60 MG PO CPDR: 60.0000 mg | DELAYED_RELEASE_CAPSULE | Freq: Every day | ORAL | Status: DC

## 2011-09-04 MED ORDER — COLESEVELAM HCL 625 MG PO TABS: 1875.0000 mg | ORAL_TABLET | Freq: Every day | ORAL | Status: DC

## 2011-09-04 NOTE — Progress Notes (Signed)
This is a extremely nice 45 year old Caucasian female with chronic obesity status post cholecystectomy one year ago. She has a long history of acid reflux and NSAID-induced gastritis-duodenitis with endoscopy 2 years ago. For some reason, she discontinued her daily Nexium therapy. He now describes several months of constant nausea with vague epigastric discomfort, and has resumed NSAID use. She denies true reflux symptoms or dysphagia or any specific hepatobiliary symptomatology. She recent saw Dr. Chevis Pretty, [facility], and is scheduled for ventral hernia repair. The patient has had diarrhea for the last year since her cholecystectomy, and also has a history of lactose intolerance. Treatment with Librax 3 times a day has not helped any of her symptomatology. She currently denies rectal bleeding, melena, fever, chills, or systemic complaints.  Current Medications, Allergies, Past Medical History, Past Surgical History, Family History and Social History were reviewed in [employer] record.  Pertinent Review of Systems Negative... severe nausea as her main complaint with associated fatigue and decreased sense of well being.   Physical Exam: Obese patient appeared her stated age in no acute distress. Chest is clear and she appears to be in a regular rhythm without murmurs gallops or rubs. She has obese abdomen without definite organomegaly, masses or tenderness. She does appear to have a ventral wall hernia in the midabdomen. Bowel sounds are nonobstructive. Mental status is norma,l and peripheral extremities are unremarkable.    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 I have scheduled her for endoscopic exam, have placed her on Dexilant 60 mg a day, gastroparesis 3 diet, and for her diarrhea we will try WelChol daily as a bile salt binder. I have asked her to discontinue Librax. She is to  keep her surgical appointment as scheduled. Encounter Diagnoses  Name Primary?  . Nausea Yes  . Diarrhea following gastrointestinal surgery   . Hernia of unspecified site of abdominal cavity without mention of obstruction or gangrene   . Obesity   . S/P cholecystectomy   . History of gastroesophageal reflux (GERD)

## 2011-09-04 NOTE — Patient Instructions (Signed)
Your procedure has been scheduled for 09/08/2011, please follow the seperate instructions.  Propofol handout given. Please go to the basement today for your labs.  Your prescription(s) have been sent to you pharmacy.  Follow Step 3 Gastroparesis diet.

## 2011-09-05 LAB — CELIAC PANEL 10
Gliadin IgA: 4.7 U/mL (ref <20)
Gliadin IgG: 9 U/mL (ref <20)
Tissue Transglutaminase Ab, IgA: 6.8 U/mL (ref <20)

## 2011-09-08 ENCOUNTER — Ambulatory Visit (AMBULATORY_SURGERY_CENTER): Payer: 59 | Admitting: Gastroenterology

## 2011-09-08 ENCOUNTER — Encounter: Payer: Self-pay | Admitting: Gastroenterology

## 2011-09-08 DIAGNOSIS — K2940 Chronic atrophic gastritis without bleeding: Secondary | ICD-10-CM

## 2011-09-08 DIAGNOSIS — R1013 Epigastric pain: Secondary | ICD-10-CM

## 2011-09-08 DIAGNOSIS — K222 Esophageal obstruction: Secondary | ICD-10-CM

## 2011-09-08 DIAGNOSIS — K2980 Duodenitis without bleeding: Secondary | ICD-10-CM

## 2011-09-08 DIAGNOSIS — R110 Nausea: Secondary | ICD-10-CM

## 2011-09-08 DIAGNOSIS — K219 Gastro-esophageal reflux disease without esophagitis: Secondary | ICD-10-CM

## 2011-09-08 DIAGNOSIS — K2970 Gastritis, unspecified, without bleeding: Secondary | ICD-10-CM

## 2011-09-08 MED ORDER — SODIUM CHLORIDE 0.9 % IV SOLN: 500.0000 mL | INTRAVENOUS | Status: DC

## 2011-09-08 NOTE — Patient Instructions (Signed)
Follow the discharge instructions on the green and blue instruction sheets.  Follow the Dilatation Diet Today:    Nothing to eat or drink until 4:00 pm.    4:00 to 5:00 CLEAR liquids only.  After 5:00 SOFT foods only.  Resume your regular diet tomorrow.  Appointment with Dr. Jarold Motto October 09, 2011 at 8:45.  Resume your medications.

## 2011-09-08 NOTE — Progress Notes (Signed)
Patient did not experience any of the following events: a burn prior to discharge; a fall within the facility; wrong site/side/patient/procedure/implant event; or a hospital transfer or hospital admission upon discharge from the facility. (G8907) Patient did not have preoperative order for IV antibiotic SSI prophylaxis. (G8918)  

## 2011-09-09 ENCOUNTER — Telehealth: Payer: Self-pay | Admitting: *Deleted

## 2011-09-09 NOTE — Telephone Encounter (Signed)

## 2011-09-10 ENCOUNTER — Other Ambulatory Visit: Payer: 59

## 2011-09-10 DIAGNOSIS — K2970 Gastritis, unspecified, without bleeding: Secondary | ICD-10-CM

## 2011-09-12 NOTE — Progress Notes (Signed)
Addended by: Thereasa Solo A on: 09/12/2011 02:44 PM   Modules accepted: Orders

## 2011-09-15 ENCOUNTER — Other Ambulatory Visit (INDEPENDENT_AMBULATORY_CARE_PROVIDER_SITE_OTHER): Payer: Self-pay | Admitting: General Surgery

## 2011-09-15 DIAGNOSIS — R110 Nausea: Secondary | ICD-10-CM | POA: Insufficient documentation

## 2011-09-15 DIAGNOSIS — K219 Gastro-esophageal reflux disease without esophagitis: Secondary | ICD-10-CM | POA: Insufficient documentation

## 2011-09-15 DIAGNOSIS — K2980 Duodenitis without bleeding: Secondary | ICD-10-CM | POA: Insufficient documentation

## 2011-09-15 DIAGNOSIS — K2970 Gastritis, unspecified, without bleeding: Secondary | ICD-10-CM | POA: Insufficient documentation

## 2011-09-15 DIAGNOSIS — K222 Esophageal obstruction: Secondary | ICD-10-CM | POA: Insufficient documentation

## 2011-09-16 ENCOUNTER — Other Ambulatory Visit: Payer: Self-pay | Admitting: Gastroenterology

## 2011-09-16 ENCOUNTER — Encounter: Payer: Self-pay | Admitting: Gastroenterology

## 2011-09-16 DIAGNOSIS — K2980 Duodenitis without bleeding: Secondary | ICD-10-CM

## 2011-09-16 DIAGNOSIS — K2990 Gastroduodenitis, unspecified, without bleeding: Secondary | ICD-10-CM

## 2011-09-16 DIAGNOSIS — K2970 Gastritis, unspecified, without bleeding: Secondary | ICD-10-CM

## 2011-10-03 ENCOUNTER — Encounter: Payer: Self-pay | Admitting: *Deleted

## 2011-10-09 ENCOUNTER — Ambulatory Visit: Payer: 59 | Admitting: Gastroenterology

## 2012-01-14 ENCOUNTER — Encounter: Payer: Self-pay | Admitting: Gastroenterology

## 2012-02-16 ENCOUNTER — Telehealth: Payer: Self-pay | Admitting: Gastroenterology

## 2012-02-16 ENCOUNTER — Encounter: Payer: Self-pay | Admitting: Gastroenterology

## 2012-02-16 NOTE — Telephone Encounter (Signed)
Patient states that she is on Welchol and it is not helping her Diarrhea and I have advised her she will need an office visit if she is having problems she will call back when she gets to work tomorrow to make an office visit.

## 2012-02-18 ENCOUNTER — Other Ambulatory Visit: Payer: Self-pay | Admitting: Gynecology

## 2012-04-12 ENCOUNTER — Ambulatory Visit (AMBULATORY_SURGERY_CENTER): Payer: 59 | Admitting: *Deleted

## 2012-04-12 VITALS — Ht 65.0 in | Wt 240.0 lb

## 2012-04-12 DIAGNOSIS — Z1211 Encounter for screening for malignant neoplasm of colon: Secondary | ICD-10-CM

## 2012-04-12 MED ORDER — MOVIPREP 100 G PO SOLR: ORAL | Status: DC

## 2012-04-13 ENCOUNTER — Encounter: Payer: Self-pay | Admitting: Gastroenterology

## 2012-04-26 ENCOUNTER — Ambulatory Visit (AMBULATORY_SURGERY_CENTER): Payer: 59 | Admitting: Gastroenterology

## 2012-04-26 ENCOUNTER — Encounter: Payer: Self-pay | Admitting: Gastroenterology

## 2012-04-26 VITALS — BP 101/69 | HR 70 | Temp 97.7°F | Resp 12 | Ht 65.0 in | Wt 240.0 lb

## 2012-04-26 DIAGNOSIS — Z1211 Encounter for screening for malignant neoplasm of colon: Secondary | ICD-10-CM

## 2012-04-26 DIAGNOSIS — Z86010 Personal history of colonic polyps: Secondary | ICD-10-CM

## 2012-04-26 MED ORDER — SODIUM CHLORIDE 0.9 % IV SOLN: 500.0000 mL | INTRAVENOUS | Status: DC

## 2012-04-26 NOTE — Progress Notes (Signed)
Patient did not experience any of the following events: a burn prior to discharge; a fall within the facility; wrong site/side/patient/procedure/implant event; or a hospital transfer or hospital admission upon discharge from the facility. (G8907) Patient did not have preoperative order for IV antibiotic SSI prophylaxis. (G8918)  

## 2012-04-26 NOTE — Op Note (Signed)
[facility] [address] [place], [place]  [address]  COLONOSCOPY PROCEDURE REPORT  PATIENT:  Lori Barber, Lori Barber  MR#:  [id number] BIRTHDATE:  05/09/1966, 46 yrs. old  GENDER:  female ENDOSCOPIST:  Vania Rea. Jarold Motto, MD, [facility] REF. BY: PROCEDURE DATE:  04/26/2012 PROCEDURE:  Diagnostic Colonoscopy ASA CLASS:  Class II INDICATIONS:  history of pre-cancerous (adenomatous) colon polyps  MEDICATIONS:   propofol (Diprivan) 260 mg IV  DESCRIPTION OF PROCEDURE:   After the risks and benefits and of the procedure were explained, informed consent was obtained. Digital rectal exam was performed and revealed no abnormalities. The LB [id number] endoscope was introduced through the anus and advanced to the cecum, which was identified by both the appendix and ileocecal valve.  The quality of the prep was good, using MoviPrep.  The instrument was then slowly withdrawn as the colon was fully examined. <<PROCEDUREIMAGES>>  FINDINGS:  No polyps or cancers were seen.  This was otherwise a normal examination of the colon.   Retroflexed views in the rectum revealed no abnormalities.    The scope was then withdrawn from the patient and the procedure completed.  COMPLICATIONS:  None ENDOSCOPIC IMPRESSION: 1) No polyps or cancers 2) Otherwise normal examination RECOMMENDATIONS: 1) Given your personal history of adenomatous (pre-cancerous) polyps, you will need a repeat colonoscopy in 5 years.  REPEAT EXAM:  No  ______________________________ Vania Rea. Jarold Motto, MD, [facility]  CC:  Lilyan Punt, MD  n. Rosalie DoctorMarland Kitchen   Vania Rea. Patterson at 04/26/2012 09:28 AM  Orbie Hurst, [phone]

## 2012-04-26 NOTE — Patient Instructions (Addendum)
Discharge instructions given with verbal understanding. Normal exam. Resume previous medications. YOU HAD AN ENDOSCOPIC PROCEDURE TODAY AT THE [facility]: Refer to the procedure report that was given to you for any specific questions about what was found during the examination.  If the procedure report does not answer your questions, please call your gastroenterologist to clarify.  If you requested that your care partner not be given the details of your procedure findings, then the procedure report has been included in a sealed envelope for you to review at your convenience later.  YOU SHOULD EXPECT: Some feelings of bloating in the abdomen. Passage of more gas than usual.  Walking can help get rid of the air that was put into your GI tract during the procedure and reduce the bloating. If you had a lower endoscopy (such as a colonoscopy or flexible sigmoidoscopy) you may notice spotting of blood in your stool or on the toilet paper. If you underwent a bowel prep for your procedure, then you may not have a normal bowel movement for a few days.  DIET: Your first meal following the procedure should be a light meal and then it is ok to progress to your normal diet.  A half-sandwich or bowl of soup is an example of a good first meal.  Heavy or fried foods are harder to digest and may make you feel nauseous or bloated.  Likewise meals heavy in dairy and vegetables can cause extra gas to form and this can also increase the bloating.  Drink plenty of fluids but you should avoid alcoholic beverages for 24 hours.  ACTIVITY: Your care partner should take you home directly after the procedure.  You should plan to take it easy, moving slowly for the rest of the day.  You can resume normal activity the day after the procedure however you should NOT DRIVE or use heavy machinery for 24 hours (because of the sedation medicines used during the test).    SYMPTOMS TO REPORT IMMEDIATELY: A gastroenterologist  can be reached at any hour.  During normal business hours, 8:30 AM to 5:00 PM Monday through Friday, call [phone].  After hours and on weekends, please call the GI answering service at [phone] who will take a message and have the physician on call contact you.   Following lower endoscopy (colonoscopy or flexible sigmoidoscopy):  Excessive amounts of blood in the stool  Significant tenderness or worsening of abdominal pains  Swelling of the abdomen that is new, acute  Fever of 100F or higher  FOLLOW UP: If any biopsies were taken you will be contacted by phone or by letter within the next 1-3 weeks.  Call your gastroenterologist if you have not heard about the biopsies in 3 weeks.  Our staff will call the home number listed on your records the next business day following your procedure to check on you and address any questions or concerns that you may have at that time regarding the information given to you following your procedure. This is a courtesy call and so if there is no answer at the home number and we have not heard from you through the emergency physician on call, we will assume that you have returned to your regular daily activities without incident.  SIGNATURES/CONFIDENTIALITY: You and/or your care partner have signed paperwork which will be entered into your electronic medical record.  These signatures attest to the fact that that the information above on your After Visit Summary has been reviewed   and is understood.  Full responsibility of the confidentiality of this discharge information lies with you and/or your care-partner. 

## 2012-04-27 ENCOUNTER — Telehealth: Payer: Self-pay | Admitting: *Deleted

## 2012-04-27 NOTE — Telephone Encounter (Signed)
Message left

## 2012-06-08 ENCOUNTER — Other Ambulatory Visit: Payer: Self-pay | Admitting: Endocrinology

## 2012-06-08 DIAGNOSIS — E049 Nontoxic goiter, unspecified: Secondary | ICD-10-CM

## 2012-06-11 ENCOUNTER — Ambulatory Visit
Admission: RE | Admit: 2012-06-11 | Discharge: 2012-06-11 | Disposition: A | Discharge status: Home / Self Care | Payer: 59 | Source: Ambulatory Visit | Attending: Endocrinology | Admitting: Endocrinology

## 2012-06-11 DIAGNOSIS — E049 Nontoxic goiter, unspecified: Secondary | ICD-10-CM

## 2012-10-02 ENCOUNTER — Ambulatory Visit (HOSPITAL_COMMUNITY): Payer: 59 | Attending: Emergency Medicine

## 2012-10-02 ENCOUNTER — Encounter (HOSPITAL_COMMUNITY): Payer: Self-pay | Admitting: Emergency Medicine

## 2012-10-02 ENCOUNTER — Emergency Department (HOSPITAL_COMMUNITY)
Admission: EM | Admit: 2012-10-02 | Discharge: 2012-10-02 | Disposition: A | Discharge status: Home / Self Care | Payer: 59 | Attending: Emergency Medicine | Admitting: Emergency Medicine

## 2012-10-02 DIAGNOSIS — M7989 Other specified soft tissue disorders: Secondary | ICD-10-CM

## 2012-10-02 DIAGNOSIS — Z862 Personal history of diseases of the blood and blood-forming organs and certain disorders involving the immune mechanism: Secondary | ICD-10-CM | POA: Insufficient documentation

## 2012-10-02 DIAGNOSIS — Z8500 Personal history of malignant neoplasm of unspecified digestive organ: Secondary | ICD-10-CM | POA: Insufficient documentation

## 2012-10-02 DIAGNOSIS — E039 Hypothyroidism, unspecified: Secondary | ICD-10-CM | POA: Insufficient documentation

## 2012-10-02 DIAGNOSIS — Z860100 Personal history of colon polyps, unspecified: Secondary | ICD-10-CM | POA: Insufficient documentation

## 2012-10-02 DIAGNOSIS — Z8742 Personal history of other diseases of the female genital tract: Secondary | ICD-10-CM | POA: Insufficient documentation

## 2012-10-02 DIAGNOSIS — E669 Obesity, unspecified: Secondary | ICD-10-CM | POA: Insufficient documentation

## 2012-10-02 DIAGNOSIS — E119 Type 2 diabetes mellitus without complications: Secondary | ICD-10-CM | POA: Insufficient documentation

## 2012-10-02 DIAGNOSIS — Z8719 Personal history of other diseases of the digestive system: Secondary | ICD-10-CM | POA: Insufficient documentation

## 2012-10-02 DIAGNOSIS — E785 Hyperlipidemia, unspecified: Secondary | ICD-10-CM | POA: Insufficient documentation

## 2012-10-02 DIAGNOSIS — M79609 Pain in unspecified limb: Secondary | ICD-10-CM | POA: Insufficient documentation

## 2012-10-02 DIAGNOSIS — Z8659 Personal history of other mental and behavioral disorders: Secondary | ICD-10-CM | POA: Insufficient documentation

## 2012-10-02 LAB — CBC WITH DIFFERENTIAL/PLATELET
Basophils Relative: 0 % (ref 0–1)
Eosinophils Absolute: 0.4 10*3/uL (ref 0.0–0.7)
Eosinophils Relative: 4 % (ref 0–5)
Hemoglobin: 12.4 g/dL (ref 12.0–15.0)
Lymphs Abs: 2.7 10*3/uL (ref 0.7–4.0)
MCH: 30.2 pg (ref 26.0–34.0)
MCHC: 33.4 g/dL (ref 30.0–36.0)
MCV: 90.5 fL (ref 78.0–100.0)
Monocytes Relative: 9 % (ref 3–12)
Platelets: 336 10*3/uL (ref 150–400)
RBC: 4.1 MIL/uL (ref 3.87–5.11)

## 2012-10-02 LAB — COMPREHENSIVE METABOLIC PANEL
Albumin: 3.4 g/dL — ABNORMAL LOW (ref 3.5–5.2)
BUN: 14 mg/dL (ref 6–23)
Calcium: 9.1 mg/dL (ref 8.4–10.5)
GFR calc Af Amer: >90 mL/min (ref >90)
Glucose, Bld: 142 mg/dL — ABNORMAL HIGH (ref 70–99)
Total Protein: 7.2 g/dL (ref 6.0–8.3)

## 2012-10-02 LAB — GLUCOSE, CAPILLARY: Glucose-Capillary: 145 mg/dL — ABNORMAL HIGH (ref 70–99)

## 2012-10-02 LAB — D-DIMER, QUANTITATIVE: D-Dimer, Quant: <0.27 ug/mL-FEU (ref 0.00–0.48)

## 2012-10-02 MED ORDER — ENOXAPARIN SODIUM 120 MG/0.8ML ~~LOC~~ SOLN
1.0000 mg/kg | Freq: Once | SUBCUTANEOUS | Status: AC
  Administered 2012-10-02: 110 mg via SUBCUTANEOUS
  Filled 2012-10-02: qty 0.8

## 2012-10-02 NOTE — ED Provider Notes (Signed)
History     CSN: [id number]  Arrival date & time 10/02/12  0041   First MD Initiated Contact with Patient 10/02/12 0403      Chief Complaint  Patient presents with  . Leg Swelling  . Leg Pain    (Consider location/radiation/quality/duration/timing/severity/associated sxs/prior treatment) HPI Comments: Patient presents with a chief complaint of pain of her right calf and swelling of her right leg.  She reports that she first noticed the symptoms yesterday.  She describes the pain as an intense ache. Pain worse when she dorsiflexes her foot.  No injury to the area.  She denies prior history of DVT or PE.  She is currently not on hormone replacement therapy.  No prolonged travel or surgery in the past month.  She denies chest pain or SOB.  She reports that she has never had leg pain like this before.  She has not noticed any erythema or warmth of the area.  No fever or chills.  NO numbness or tingling.    The history is provided by the patient.    Past Medical History  Diagnosis Date  . Esophagitis, unspecified   . Unspecified gastritis and gastroduodenitis without mention of hemorrhage   . Obesity, unspecified   . Other chronic nonalcoholic liver disease   . Abdominal pain, epigastric   . Anal fissure   . Follicular cyst of ovary   . Unspecified hemorrhoids without mention of complication   . Irritable bowel syndrome   . Personal history of colonic polyps   . Family history of malignant neoplasm of gastrointestinal tract   . Other chronic nonalcoholic liver disease   . Unspecified hypothyroidism   . Type II or unspecified type diabetes mellitus without mention of complication, not stated as uncontrolled   . Esophageal reflux   . Hyperlipidemia   . Abdominal distention   . Anemia   . Anxiety   . Esophageal stricture   . Allergy     mold,dus, animal dander    Past Surgical History  Procedure Date  . Tonsillectomy 1994  . Cesarean section 1999  . Ovarian cyst removal  2002  . Cholecystectomy 2011  . Colonoscopy     Family History  Problem Relation Age of Onset  . Colon cancer Maternal Aunt     cousin  . Cancer Maternal Aunt     colon  . Breast cancer Mother   . Cancer Mother     breast  . Other Mother     car accident  . Heart disease Paternal Grandmother   . Other Father     liver failure  . Cancer Cousin     colon  . Colon cancer Cousin   . Esophageal cancer Maternal Uncle   . Colon cancer Maternal Uncle   . Rectal cancer Neg Hx   . Stomach cancer Neg Hx     History  Substance Use Topics  . Smoking status: Former Games developer  . Smokeless tobacco: Never Used  . Alcohol Use: No     Comment: occasionally    OB History    Grav Para Term Preterm Abortions TAB SAB Ect Mult Living                  Review of Systems  Constitutional: Negative for fever and chills.  Respiratory: Negative for shortness of breath.   Cardiovascular: Positive for leg swelling. Negative for chest pain and palpitations.  Musculoskeletal:       Right leg pain and swelling  All other systems reviewed and are negative.    Allergies  Cleocin; Penicillins; Mushroom extract complex; Amoxicillin; and Clarithromycin  Home Medications   Current Outpatient Rx  Name  Route  Sig  Dispense  Refill  . DEXILANT 60 MG PO CPDR   Oral   Take 60 mg by mouth every morning.          Marland Kitchen HYDROCODONE-ACETAMINOPHEN 5-325 MG PO TABS   Oral   Take 1 tablet by mouth every 6 (six) hours as needed. For pain         . LEVOTHYROXINE SODIUM 50 MCG PO TABS   Oral   Take 50 mcg by mouth every morning.            BP 131/78  Pulse 78  Temp 97.7 F (36.5 C) (Oral)  Resp 20  SpO2 100%  Physical Exam  Nursing note and vitals reviewed. Constitutional: She appears well-developed and well-nourished.  HENT:  Head: Normocephalic and atraumatic.  Mouth/Throat: Oropharynx is clear and moist.  Neck: Normal range of motion. Neck supple.  Cardiovascular: Normal rate,  regular rhythm and normal heart sounds.   Pulmonary/Chest: Effort normal and breath sounds normal. No respiratory distress. She has no wheezes.  Musculoskeletal: Normal range of motion.       Positive Homan's sign of the right leg Mild bilateral pitting edema No erythema or palpable cords of the right calf  Neurological: She is alert.  Skin: Skin is warm and dry. No erythema.  Psychiatric: She has a normal mood and affect.    ED Course  Procedures (including critical care time)  Labs Reviewed  GLUCOSE, CAPILLARY - Abnormal; Notable for the following:    Glucose-Capillary 145 (*)     All other components within normal limits  COMPREHENSIVE METABOLIC PANEL - Abnormal; Notable for the following:    Glucose, Bld 142 (*)     Albumin 3.4 (*)     AST 43 (*)     ALT 40 (*)     All other components within normal limits  CBC WITH DIFFERENTIAL  D-DIMER, QUANTITATIVE   No results found.   No diagnosis found.    MDM  Patient presenting with a chief complaint of right calf pain and swelling.  Positive Homan's sign on exam.  Patient given a shot of Lovenox and instructed to follow up for Doppler Ultrasound.  She denies any Chest pain or SOB at this time.        Pascal Lux [place], PA-C 10/03/12 2055

## 2012-10-03 NOTE — ED Provider Notes (Signed)
Medical screening examination/treatment/procedure(s) were performed by non-physician practitioner and as supervising physician I was immediately available for consultation/collaboration.   Joya Gaskins, MD 10/03/12 [phone]

## 2012-10-07 ENCOUNTER — Ambulatory Visit (HOSPITAL_COMMUNITY)
Admission: RE | Admit: 2012-10-07 | Discharge: 2012-10-07 | Disposition: A | Discharge status: Home / Self Care | Payer: 59 | Source: Ambulatory Visit | Attending: Emergency Medicine | Admitting: Emergency Medicine

## 2012-10-07 DIAGNOSIS — M7989 Other specified soft tissue disorders: Secondary | ICD-10-CM | POA: Insufficient documentation

## 2012-10-07 DIAGNOSIS — M79609 Pain in unspecified limb: Secondary | ICD-10-CM | POA: Insufficient documentation

## 2012-10-07 NOTE — Progress Notes (Signed)
*  PRELIMINARY RESULTS* Vascular Ultrasound Right lower extremity venous duplex has been completed.  Preliminary findings: Right:  No evidence of DVT, superficial thrombosis, or Baker's cyst.   Farrel Demark, RDMS, RVT 10/07/2012, 10:35 AM

## 2012-11-04 ENCOUNTER — Other Ambulatory Visit: Payer: Self-pay | Admitting: Gastroenterology

## 2012-11-04 NOTE — Telephone Encounter (Signed)
PLEASE MAKE A FOLLOW UP APPOINTMENT FOR FURTHER REFILLS  

## 2012-11-29 IMAGING — US US ABDOMEN COMPLETE
1 series · 13 of 25 positions shown · non-contrast
Comparison: [REDACTED]intraoperative cholangiogram
01/18/2010, [REDACTED] abdominal pelvic CT 10/09/2009 and
[HOSPITAL] abdominal ultrasound 11/05/2009.

CLINICAL DATA: Abdominal pain.  Cholecystectomy.

COMPLETE ABDOMINAL ULTRASOUND

[Series 1: us abdomen complete · 0.30mm/px · 13 of 59 slices shown]
[im 1/59]
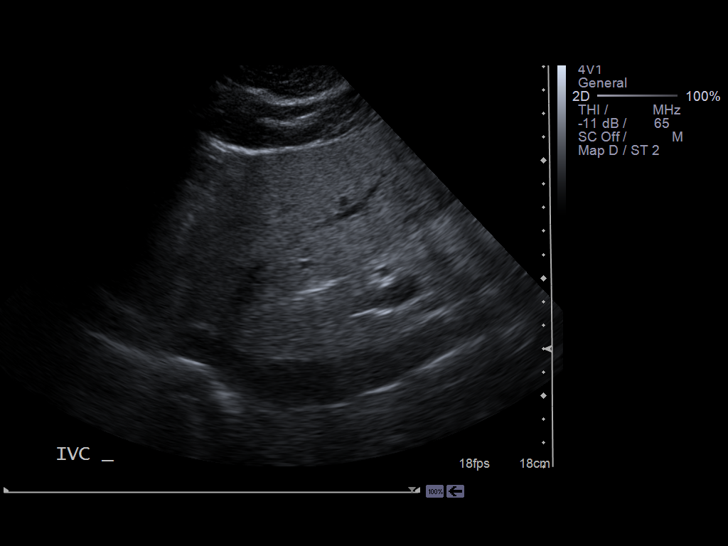
[im 5/59]
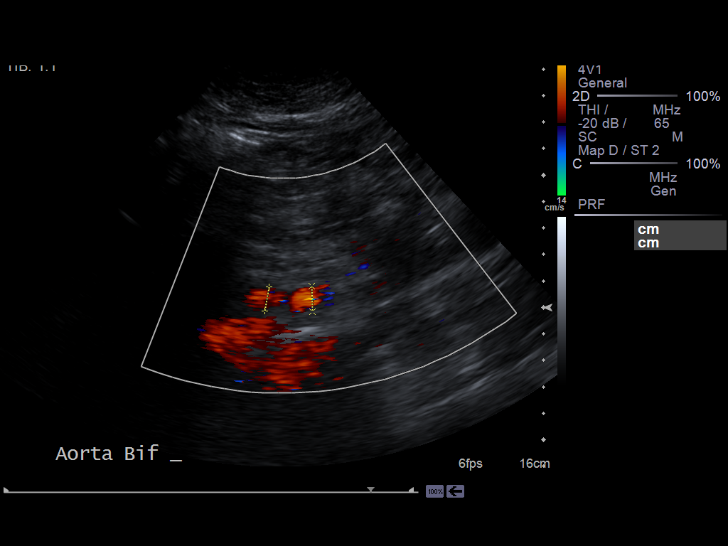
[im 10/59]
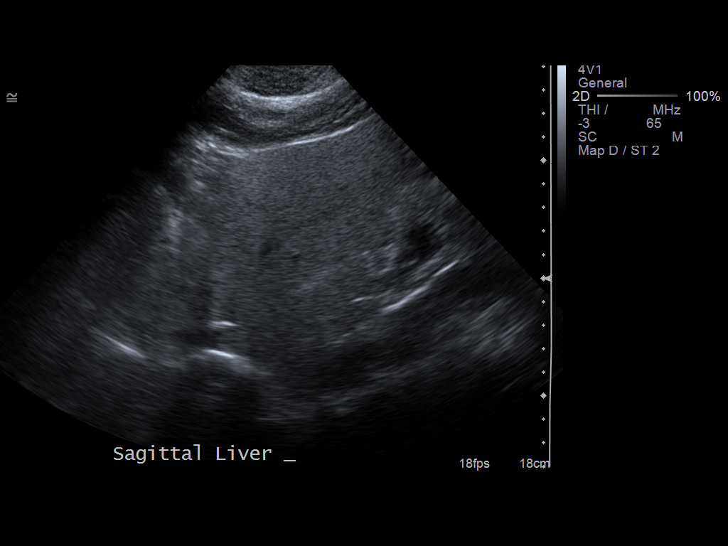
[im 15/59]
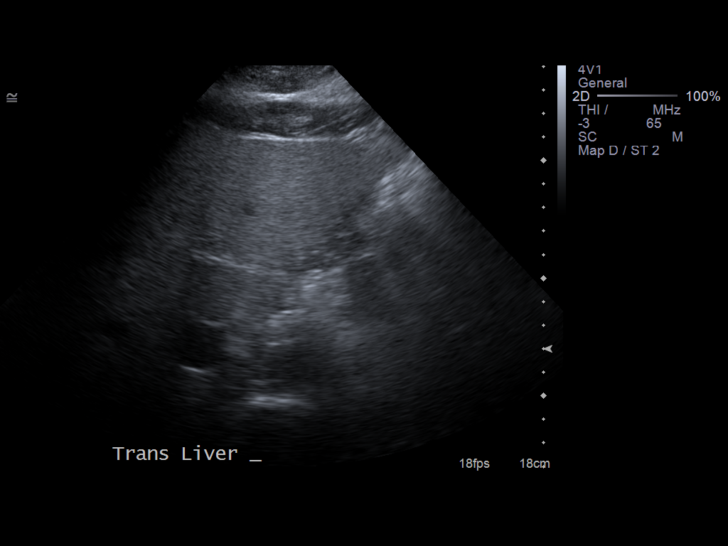
[im 20/59]
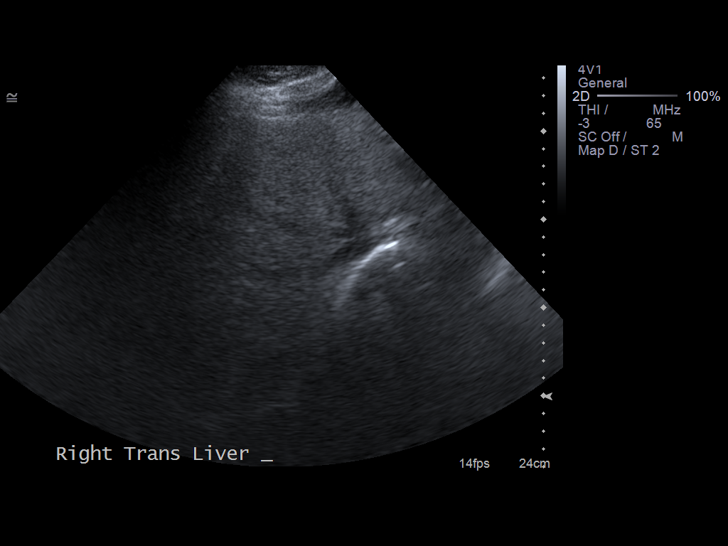
[im 25/59]
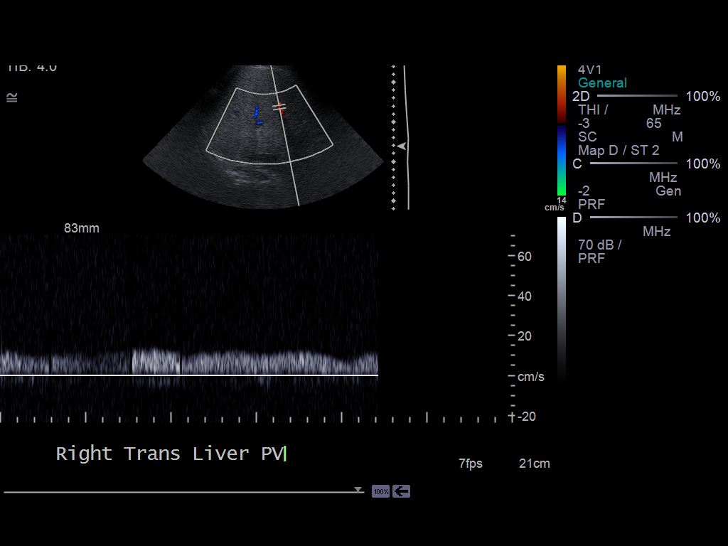
[im 30/59]
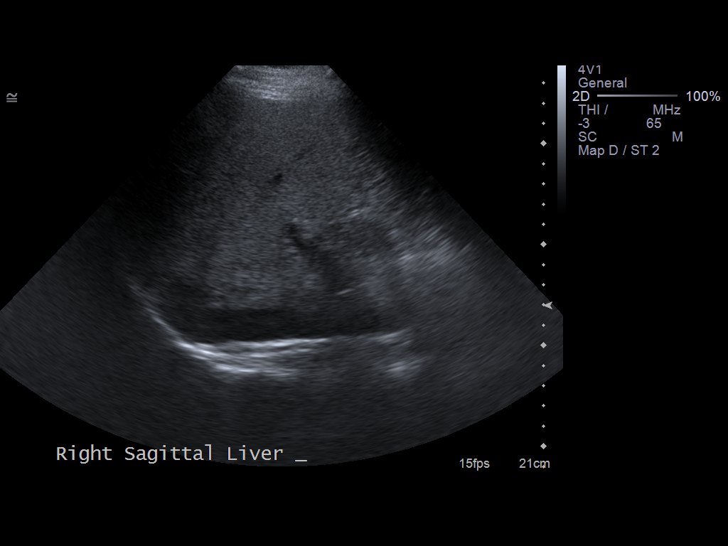
[im 34/59]
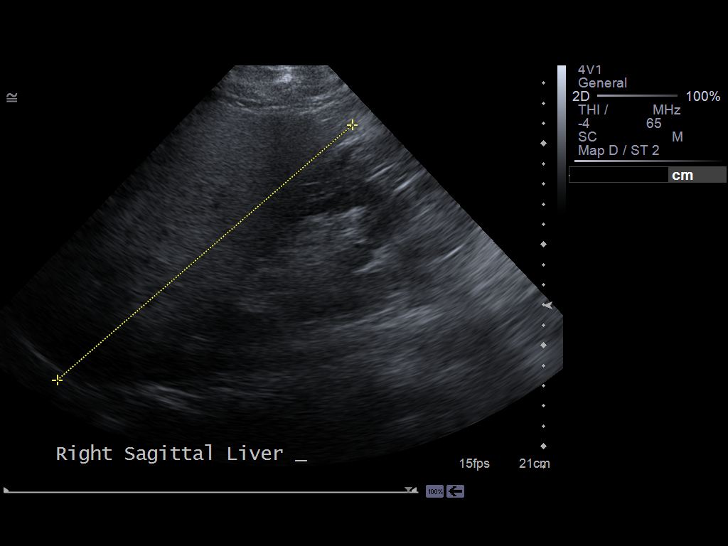
[im 39/59]
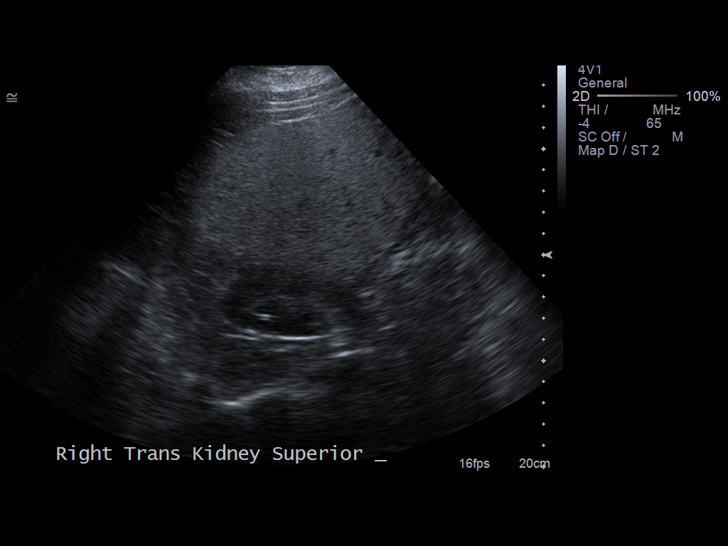
[im 44/59]
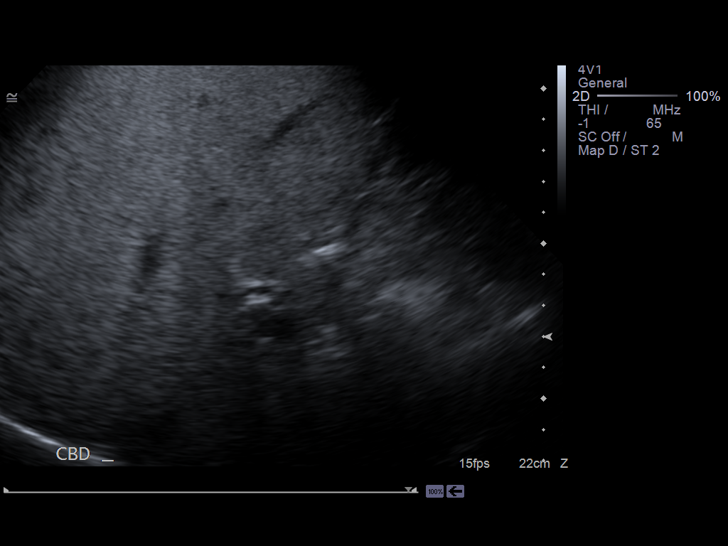
[im 49/59]
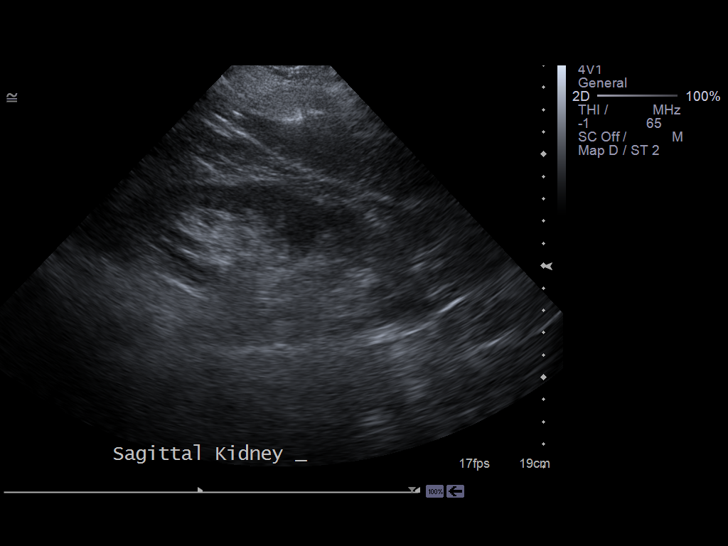
[im 54/59]
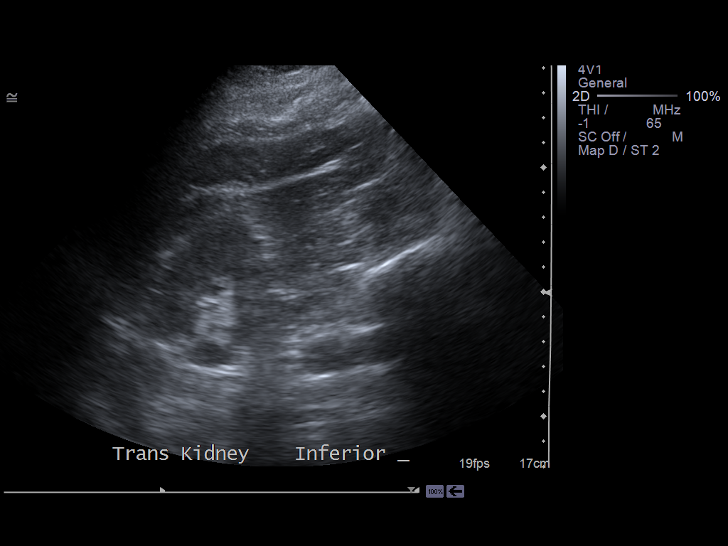
[im 59/59]
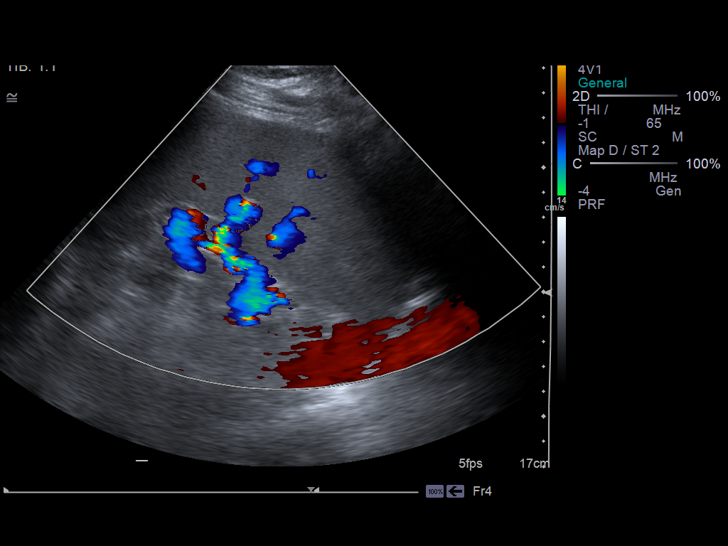

[13 of 25 positions shown; findings below may reference images not displayed]

FINDINGS: Gallbladder:  Surgically absent.

Common bile duct:  No dilated intrahepatic or extrahepatic bile
ducts with common bile duct measuring normally at 4 mm.

Liver:  Remains normal in size with stable increased echogenicity
of slight fatty change with no focal lesion.

IVC:  Appears normal.

Pancreas:  Pancreatic body appears normal with tail and head
limited for assessment due to overlying gastrointestinal gas.

Spleen:  Splenic size remains upper limits of normal currently
measuring 13.1 cm long (volume 702 cm) with no focal lesion.

Right Kidney:  Sonographically normal measuring 12.8 cm long.

Left Kidney:  Sonographically normal measuring 13.2 cm long.

Abdominal aorta:  No aneurysm identified with maximum proximal
diameter 2.6 cm.

No free fluid.
IMPRESSION: 1.  Cholecystectomy.
2.  Limited visualization pancreatic head and tail.
3.  Splenic size upper limits of normal with no focal lesion with
stable slight diffuse fatty infiltration of liver.
4.  Otherwise, negative.

## 2013-01-17 ENCOUNTER — Other Ambulatory Visit: Payer: Self-pay | Admitting: Gastroenterology

## 2013-01-17 NOTE — Telephone Encounter (Signed)
PATIENT NEEDS OFFICE VISIT FOR FURTHER REFILLS  

## 2013-01-19 ENCOUNTER — Telehealth: Payer: Self-pay | Admitting: Gastroenterology

## 2013-01-19 NOTE — Telephone Encounter (Signed)
Advised patient that a prescription for Dexilant was already sent to her pharmacy on 01/17/13. Also advised that she needs office visit for further refills as we have not seen her in the office since 08/2011. She has scheduled an appointment with Dr Jarold Motto for 10:45 am on 01/28/13.

## 2013-01-24 ENCOUNTER — Encounter: Payer: Self-pay | Admitting: *Deleted

## 2013-01-28 ENCOUNTER — Ambulatory Visit: Payer: 59 | Admitting: Gastroenterology

## 2013-01-31 ENCOUNTER — Encounter: Payer: Self-pay | Admitting: *Deleted

## 2013-02-04 ENCOUNTER — Encounter: Payer: Self-pay | Admitting: Gastroenterology

## 2013-02-04 ENCOUNTER — Ambulatory Visit (INDEPENDENT_AMBULATORY_CARE_PROVIDER_SITE_OTHER): Payer: 59 | Admitting: Gastroenterology

## 2013-02-04 ENCOUNTER — Ambulatory Visit (INDEPENDENT_AMBULATORY_CARE_PROVIDER_SITE_OTHER): Payer: 59

## 2013-02-04 VITALS — BP 104/80 | HR 87 | Ht 64.75 in | Wt 242.2 lb

## 2013-02-04 DIAGNOSIS — R899 Unspecified abnormal finding in specimens from other organs, systems and tissues: Secondary | ICD-10-CM

## 2013-02-04 DIAGNOSIS — K7689 Other specified diseases of liver: Secondary | ICD-10-CM

## 2013-02-04 DIAGNOSIS — K219 Gastro-esophageal reflux disease without esophagitis: Secondary | ICD-10-CM

## 2013-02-04 DIAGNOSIS — K9089 Other intestinal malabsorption: Secondary | ICD-10-CM

## 2013-02-04 DIAGNOSIS — Z9889 Other specified postprocedural states: Secondary | ICD-10-CM

## 2013-02-04 DIAGNOSIS — R6889 Other general symptoms and signs: Secondary | ICD-10-CM

## 2013-02-04 DIAGNOSIS — Z9049 Acquired absence of other specified parts of digestive tract: Secondary | ICD-10-CM

## 2013-02-04 DIAGNOSIS — K760 Fatty (change of) liver, not elsewhere classified: Secondary | ICD-10-CM

## 2013-02-04 LAB — HEPATITIS B SURFACE ANTIGEN: Hepatitis B Surface Ag: NEGATIVE

## 2013-02-04 LAB — VITAMIN B12: Vitamin B-12: 480 pg/mL (ref 211–911)

## 2013-02-04 LAB — PROTIME-INR: Prothrombin Time: 12 s (ref 10.2–12.4)

## 2013-02-04 LAB — HEPATITIS C ANTIBODY: HCV Ab: NEGATIVE

## 2013-02-04 LAB — FOLATE: Folate: 21.2 ng/mL (ref >5.9)

## 2013-02-04 LAB — FERRITIN: Ferritin: 153.6 ng/mL (ref 10.0–291.0)

## 2013-02-04 MED ORDER — DEXLANSOPRAZOLE 60 MG PO CPDR: 60.0000 mg | DELAYED_RELEASE_CAPSULE | Freq: Every day | ORAL | Status: DC

## 2013-02-04 MED ORDER — COLESTIPOL HCL 1 G PO TABS: 1.0000 g | ORAL_TABLET | Freq: Every day | ORAL | Status: DC

## 2013-02-04 MED ORDER — CILIDINIUM-CHLORDIAZEPOXIDE 2.5-5 MG PO CAPS: 1.0000 | ORAL_CAPSULE | Freq: Three times a day (TID) | ORAL | Status: DC | PRN

## 2013-02-04 NOTE — Patient Instructions (Addendum)
  We have sent the following medications to your pharmacy for you to pick up at your convenience:  Colestid, please take one tablet by mouth once daily. Librax, please take one tablet by mouth three times daily as needed.  Your physician has requested that you go to the basement for lab work before leaving today.  You will receive a call about your Dietician appointment from [facility]. _____________________________________________________________________________________________                                               We are excited to introduce MyChart, a new best-in-class service that provides you online access to important information in your electronic medical record. We want to make it easier for you to view your health information - all in one secure location - when and where you need it. We expect MyChart will enhance the quality of care and service we provide.  When you register for MyChart, you can:    View your test results.    Request appointments and receive appointment reminders via email.    Request medication renewals.    View your medical history, allergies, medications and immunizations.    Communicate with your physician's office through a password-protected site.    Conveniently print information such as your medication lists.  To find out if MyChart is right for you, please talk to a member of our clinical staff today. We will gladly answer your questions about this free health and wellness tool.  If you are age 49 or older and want a member of your family to have access to your record, you must provide written consent by completing a proxy form available at our office. Please speak to our clinical staff about guidelines regarding accounts for patients younger than age 40.  As you activate your MyChart account and need any technical assistance, please call the MyChart technical support line at [phone]) or email your question to  [email or web address]. If you email your question(s), please include your name, a return phone number and the best time to reach you.  If you have non-urgent health-related questions, you can send a message to our office through MyChart at [email or web address]. If you have a medical emergency, call 911.  Thank you for using MyChart as your new health and wellness resource!   MyChart licensed from [employer],  [id number]. Patents Pending.

## 2013-02-04 NOTE — Progress Notes (Signed)
This is a 47 year old Caucasian female with chronic morbid obesity and associated fatty liver with mild hypertransaminasemia over the last 2 years.  She denies right upper quadrant pain or any specific hepatobiliary symptoms.  Her main complaint is alternating diarrhea and constipation with gas and bloating all worse since her cholecystectomy.  She has had previous tattoosno IV drug use, previous hepatitis or pancreatitis, and there is no family history of liver disease.  Review of her labs shows continued mild elevated serum transaminases without other liver function test abnormalities..  The extent of her liver prior workup is unclear.  She is on Synthroid, Dexilant 60 mg a day for acid reflux, and when necessary hydrocodone for pain.  She has multiple antibiotic allergies.  The patient denies a specific food intolerances, melena or hematochezia.  She is up-to-date on her endoscopy and colonoscopy exams.  Current Medications, Allergies, Past Medical History, Past Surgical History, Family History and Social History were reviewed in [employer] record.  ROS: All systems were reviewed and are negative unless otherwise stated in the HPI.          Physical Exam: Obese patient in no acute distress.  Blood pressure 104/80, pulse 87 and regular and weight 242 pounds with a BMI of 40.61.  I cannot appreciate stigmata of chronic liver disease.  Chest is clear she is in a regular rhythm without murmurs gallops or rubs.  I cannot appreciate hepatosplenomegaly, abdominal masses or tenderness.  Bowel sounds are normal.  Peripheral extremities are unremarkable and mental status is normal.    Assessment and Plan: Post cholecystectomy diarrhea with previous good response to Colestid which she discontinued because of constipation.  I have restarted Colestid 1 g a day to be adjusted as needed per her bowel habits.  Because of  fatty liver disease,we  have referred her to our dietitian for a  gradual weight loss reduction diet.  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  First, we will do further hepatic workup including hepatitis serologies, AMA, ANA, PT, ceruloplasmin, alpha-1 antitrypsin level, and we will follow her liver function tests hopefully with slow weight loss.  I've asked her to avoid nonabsorbable carbohydrates in her diet, and have renewed her Dexilant, we'll see her back in several months time for followup.  Review of her x-ray shows previous CT and ultrasound which confirmed appearance is a fatty liver  infiltration.  There is no history of alcohol abuse.  Please copy Dr. Herb Grays Encounter Diagnoses  Name Primary?  . Abnormal laboratory test Yes  . Fatty liver

## 2013-02-04 NOTE — Addendum Note (Signed)
Addended by: Ok Anis A on: 02/04/2013 12:11 PM   Modules accepted: Orders

## 2013-02-05 LAB — CERULOPLASMIN: Ceruloplasmin: 27 mg/dL (ref 20–60)

## 2013-02-05 LAB — ALPHA-1-ANTITRYPSIN: A-1 Antitrypsin, Ser: 128 mg/dL (ref 90–200)

## 2013-02-07 LAB — ANA: Anti Nuclear Antibody(ANA): POSITIVE — AB

## 2013-02-09 ENCOUNTER — Other Ambulatory Visit: Payer: Self-pay

## 2013-02-09 DIAGNOSIS — R7401 Elevation of levels of liver transaminase levels: Secondary | ICD-10-CM

## 2013-02-09 DIAGNOSIS — K760 Fatty (change of) liver, not elsewhere classified: Secondary | ICD-10-CM

## 2013-02-17 ENCOUNTER — Telehealth: Payer: Self-pay | Admitting: Gastroenterology

## 2013-02-18 ENCOUNTER — Other Ambulatory Visit (HOSPITAL_COMMUNITY): Payer: 59

## 2013-02-18 NOTE — Telephone Encounter (Signed)
Pt is concerned about the reason she needs a Liver Bx. We discussed her lab results, last OV and why Dr Jarold Motto feels she needs the liver bx. Pt states she had an US done in January and all it showed was a fatty liver. Pt is also concerned about new pain in her upper abdomen. She states she called for an appt and no one would give her one. Called and received a copy of U/S from [facility] And she will see Dr Jarold Motto on 02/22/13.

## 2013-02-21 ENCOUNTER — Other Ambulatory Visit: Payer: Self-pay | Admitting: Neurosurgery

## 2013-02-21 DIAGNOSIS — M4310 Spondylolisthesis, site unspecified: Secondary | ICD-10-CM

## 2013-02-22 ENCOUNTER — Other Ambulatory Visit (INDEPENDENT_AMBULATORY_CARE_PROVIDER_SITE_OTHER): Payer: 59

## 2013-02-22 ENCOUNTER — Ambulatory Visit (INDEPENDENT_AMBULATORY_CARE_PROVIDER_SITE_OTHER): Payer: 59 | Admitting: Gastroenterology

## 2013-02-22 ENCOUNTER — Encounter: Payer: Self-pay | Admitting: Gastroenterology

## 2013-02-22 VITALS — BP 112/74 | HR 88 | Ht 64.75 in | Wt 242.2 lb

## 2013-02-22 DIAGNOSIS — R7989 Other specified abnormal findings of blood chemistry: Secondary | ICD-10-CM

## 2013-02-22 DIAGNOSIS — R945 Abnormal results of liver function studies: Secondary | ICD-10-CM

## 2013-02-22 DIAGNOSIS — K760 Fatty (change of) liver, not elsewhere classified: Secondary | ICD-10-CM

## 2013-02-22 DIAGNOSIS — K7689 Other specified diseases of liver: Secondary | ICD-10-CM

## 2013-02-22 DIAGNOSIS — M2550 Pain in unspecified joint: Secondary | ICD-10-CM

## 2013-02-22 DIAGNOSIS — K589 Irritable bowel syndrome without diarrhea: Secondary | ICD-10-CM

## 2013-02-22 LAB — HEPATIC FUNCTION PANEL
ALT: 48 U/L — ABNORMAL HIGH (ref 0–35)
AST: 44 U/L — ABNORMAL HIGH (ref 0–37)
Total Bilirubin: 0.6 mg/dL (ref 0.3–1.2)
Total Protein: 7.7 g/dL (ref 6.0–8.3)

## 2013-02-22 NOTE — Patient Instructions (Signed)
Please keep your Liver Biopsy appointment on 03-04-13 at [facility].  Go to Short Stay at 7:45 am to register.  Your physician has requested that you go to the basement for the following lab work before leaving today: Liver Profile

## 2013-02-22 NOTE — Progress Notes (Signed)
History of Present Illness:  This is a 47 year old Caucasian female chronic thyroid dysfunction and chronic low back pain using when necessary hydrocodone.  I have seen her recently because of mildly abnormal serum transaminase levels.  Hepatic workup  ultrasound consistent with fatty infiltration, and otherwise negative serologies except for a positive AMA and a weakly positive speckled ANA of 1-40.  The patient does complain of diffuse arthralgias and photosensitivity, but no other skin rashes or symptoms of collagen vascular disease or Raynaud's phenomenon.  There is no history of right upper quadrant pain, itching, fatigue, or any systemic complaints.  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  She is status post cholecystectomy, has periodic bile salt diarrhea.  As mentioned above, liver workup otherwise has been negative including hepatitis serologies.  I have reviewed this patient's present history, medical and surgical past history, allergies and medications.     ROS:   All systems were reviewed and are negative unless otherwise stated in the HPI.    Physical Exam: Blood pressure 120/74, pulse 88 and regular, and weight 242 with a BMI of 40.61. General well developed well nourished patient in no acute distress, appearing their stated age Eyes PERRLA, no icterus, fundoscopic exam per opthamologist Skin no lesions noted Neck supple, no adenopathy, no thyroid enlargement, no tenderness Chest clear to percussion and auscultation Heart no significant murmurs, gallops or rubs noted Abdomen no hepatosplenomegaly masses or tenderness, BS normal.  Extremities no acute joint lesions, edema, phlebitis or evidence of cellulitis. Neurologic patient oriented x 3, cranial nerves intact, no focal neurologic deficits noted. Psychological mental status normal and normal affect.  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  I have urged her to follow through with her scheduled ultrasound-guided percutaneous liver biopsy.  I again have reviewed the risk and benefits of this procedure, and she is to avoid NSAIDs and aspirin at least 7 days before this procedure.  She denies diarrhea related problems at this time.  Encounter Diagnosis  Name Primary?  . Fatty liver Yes

## 2013-02-23 ENCOUNTER — Other Ambulatory Visit: Payer: Self-pay | Admitting: Radiology

## 2013-02-25 ENCOUNTER — Other Ambulatory Visit (HOSPITAL_COMMUNITY): Payer: 59

## 2013-03-01 ENCOUNTER — Other Ambulatory Visit: Payer: Self-pay | Admitting: Radiology

## 2013-03-04 ENCOUNTER — Encounter (HOSPITAL_COMMUNITY): Payer: Self-pay

## 2013-03-04 ENCOUNTER — Ambulatory Visit (HOSPITAL_COMMUNITY)
Admission: RE | Admit: 2013-03-04 | Discharge: 2013-03-04 | Disposition: A | Discharge status: Home / Self Care | Payer: 59 | Source: Ambulatory Visit | Attending: Gastroenterology | Admitting: Gastroenterology

## 2013-03-04 DIAGNOSIS — Z800 Family history of malignant neoplasm of digestive organs: Secondary | ICD-10-CM | POA: Insufficient documentation

## 2013-03-04 DIAGNOSIS — E039 Hypothyroidism, unspecified: Secondary | ICD-10-CM | POA: Insufficient documentation

## 2013-03-04 DIAGNOSIS — K760 Fatty (change of) liver, not elsewhere classified: Secondary | ICD-10-CM

## 2013-03-04 DIAGNOSIS — K589 Irritable bowel syndrome without diarrhea: Secondary | ICD-10-CM | POA: Insufficient documentation

## 2013-03-04 DIAGNOSIS — R7401 Elevation of levels of liver transaminase levels: Secondary | ICD-10-CM

## 2013-03-04 DIAGNOSIS — Z860100 Personal history of colon polyps, unspecified: Secondary | ICD-10-CM | POA: Insufficient documentation

## 2013-03-04 DIAGNOSIS — K7689 Other specified diseases of liver: Secondary | ICD-10-CM | POA: Insufficient documentation

## 2013-03-04 DIAGNOSIS — K219 Gastro-esophageal reflux disease without esophagitis: Secondary | ICD-10-CM | POA: Insufficient documentation

## 2013-03-04 LAB — CBC
HCT: 38.8 % (ref 36.0–46.0)
MCV: 89.4 fL (ref 78.0–100.0)
RBC: 4.34 MIL/uL (ref 3.87–5.11)
WBC: 9 10*3/uL (ref 4.0–10.5)

## 2013-03-04 LAB — GLUCOSE, CAPILLARY: Glucose-Capillary: 121 mg/dL — ABNORMAL HIGH (ref 70–99)

## 2013-03-04 MED ORDER — HYDROCODONE-ACETAMINOPHEN 5-325 MG PO TABS
1.0000 | ORAL_TABLET | ORAL | Status: DC | PRN
  Administered 2013-03-04: 2 via ORAL
  Filled 2013-03-04: qty 2

## 2013-03-04 MED ORDER — FENTANYL CITRATE 0.05 MG/ML IJ SOLN
INTRAMUSCULAR | Status: AC
  Filled 2013-03-04: qty 4

## 2013-03-04 MED ORDER — FENTANYL CITRATE 0.05 MG/ML IJ SOLN
INTRAMUSCULAR | Status: AC | PRN
  Administered 2013-03-04: 25 ug via INTRAVENOUS
  Administered 2013-03-04 (×2): 50 ug via INTRAVENOUS

## 2013-03-04 MED ORDER — SODIUM CHLORIDE 0.9 % IV SOLN
Freq: Once | INTRAVENOUS | Status: AC
  Administered 2013-03-04: 09:00:00 via INTRAVENOUS

## 2013-03-04 MED ORDER — MIDAZOLAM HCL 2 MG/2ML IJ SOLN
INTRAMUSCULAR | Status: AC
  Filled 2013-03-04: qty 4

## 2013-03-04 MED ORDER — MIDAZOLAM HCL 2 MG/2ML IJ SOLN
INTRAMUSCULAR | Status: AC | PRN
  Administered 2013-03-04 (×2): 1 mg via INTRAVENOUS

## 2013-03-04 NOTE — H&P (Signed)
Lori Barber is an 47 y.o. female.   Chief Complaint: Dx "fatty " liver; elevated liver enzymes 2010 Followed by Dr Jarold Motto Recent labs show even higher LFTs Now scheduled for liver core biopsy HPI: fatty liver; IBS; DM; HLD  Past Medical History  Diagnosis Date  . Esophagitis, unspecified   . Unspecified gastritis and gastroduodenitis without mention of hemorrhage   . Obesity, unspecified   . Other chronic nonalcoholic liver disease   . Abdominal pain, epigastric   . Anal fissure   . Follicular cyst of ovary   . Unspecified hemorrhoids without mention of complication   . Irritable bowel syndrome   . Personal history of colonic polyps   . Family history of malignant neoplasm of gastrointestinal tract   . Other chronic nonalcoholic liver disease   . Unspecified hypothyroidism   . Type II or unspecified type diabetes mellitus without mention of complication, not stated as uncontrolled   . Esophageal reflux   . Hyperlipidemia   . Abdominal distention   . Anemia   . Anxiety   . Esophageal stricture   . Allergy     mold,dust, animal dander  . Fatty liver     Past Surgical History  Procedure Laterality Date  . Tonsillectomy  1994  . Cesarean section  1999  . Ovarian cyst removal  2002  . Cholecystectomy  2011  . Colonoscopy      Family History  Problem Relation Age of Onset  . Colon cancer Maternal Aunt     cousin  . Cancer Maternal Aunt     colon  . Breast cancer Mother   . Cancer Mother     breast  . Other Mother     car accident  . Heart disease Paternal Grandmother   . Other Father     liver failure  . Cancer Cousin     colon  . Colon cancer Cousin   . Esophageal cancer Maternal Uncle   . Colon cancer Maternal Uncle   . Rectal cancer Neg Hx   . Stomach cancer Neg Hx    Social History:  reports that she has quit smoking. She has never used smokeless tobacco. She reports that she does not drink alcohol or use illicit drugs.  Allergies:  Allergies   Allergen Reactions  . Cleocin (Clindamycin Hcl) Shortness Of Breath    Blood pressure dropping  . Penicillins Anaphylaxis and Swelling  . Mushroom Extract Complex Nausea And Vomiting and Other (See Comments)    Headache   . Amoxicillin Nausea Only  . Clarithromycin Nausea And Vomiting     (Not in Barber hospital admission)  Results for orders placed during the hospital encounter of 03/04/13 (from the past 48 hour(s))  CBC     Status: None   Collection Time    03/04/13  8:20 AM      Result Value Range   WBC 9.0  4.0 - 10.5 K/uL   RBC 4.34  3.87 - 5.11 MIL/uL   Hemoglobin 13.1  12.0 - 15.0 g/dL   HCT 16.1  09.6 - 04.5 %   MCV 89.4  78.0 - 100.0 fL   MCH 30.2  26.0 - 34.0 pg   MCHC 33.8  30.0 - 36.0 g/dL   RDW 40.9  81.1 - 91.4 %   Platelets 288  150 - 400 K/uL   No results found.  Review of Systems  Constitutional: Negative for fever.  Respiratory: Negative for shortness of breath.   Cardiovascular:  Negative for chest pain.  Gastrointestinal: Negative for vomiting, abdominal pain and diarrhea.  Neurological: Negative for weakness and headaches.    Blood pressure 142/73, pulse 74, temperature 98.1 F (36.7 C), temperature source Oral, resp. rate 18, height 5\' 5"  (1.651 m), weight 245 lb (111.131 kg), SpO2 98.00%. Physical Exam  Constitutional: She is oriented to person, place, and time. She appears well-developed and well-nourished.  Cardiovascular: Normal rate, regular rhythm and normal heart sounds.   No murmur heard. Respiratory: Effort normal and breath sounds normal. She has no wheezes.  GI: Soft. Bowel sounds are normal. There is no tenderness.  Musculoskeletal: Normal range of motion.  Neurological: She is alert and oriented to person, place, and time.  Psychiatric: She has Barber normal mood and affect. Her behavior is normal. Judgment and thought content normal.     Assessment/Plan Known elevated liver function tests Recent increase Dx fatty liver 2010 Pt  scheduled for liver core biopsy Pt aware of procedure benefits and risks and agreeable to proceed Consent signed and in chart  Lori Barber 03/04/2013, 9:04 AM

## 2013-03-04 NOTE — Procedures (Signed)
US liver core bx 18g x4 No complication No blood loss. See complete dictation in [facility].

## 2013-03-06 ENCOUNTER — Ambulatory Visit
Admission: RE | Admit: 2013-03-06 | Discharge: 2013-03-06 | Disposition: A | Discharge status: Home / Self Care | Payer: 59 | Source: Ambulatory Visit | Attending: Neurosurgery | Admitting: Neurosurgery

## 2013-03-06 DIAGNOSIS — M4310 Spondylolisthesis, site unspecified: Secondary | ICD-10-CM

## 2013-03-14 ENCOUNTER — Telehealth: Payer: Self-pay | Admitting: Gastroenterology

## 2013-03-14 NOTE — Telephone Encounter (Signed)
No answer or VM

## 2013-03-14 NOTE — Telephone Encounter (Signed)
Tried to call, but no answer.

## 2013-03-21 NOTE — Telephone Encounter (Signed)
Pt never called back.

## 2013-03-22 ENCOUNTER — Telehealth: Payer: Self-pay | Admitting: Gastroenterology

## 2013-03-22 ENCOUNTER — Encounter: Payer: Self-pay | Admitting: Gastroenterology

## 2013-03-22 ENCOUNTER — Ambulatory Visit (INDEPENDENT_AMBULATORY_CARE_PROVIDER_SITE_OTHER): Payer: 59 | Admitting: Gastroenterology

## 2013-03-22 VITALS — BP 124/76 | HR 88 | Ht 65.0 in | Wt 244.0 lb

## 2013-03-22 DIAGNOSIS — K7689 Other specified diseases of liver: Secondary | ICD-10-CM

## 2013-03-22 DIAGNOSIS — K760 Fatty (change of) liver, not elsewhere classified: Secondary | ICD-10-CM

## 2013-03-22 NOTE — Telephone Encounter (Signed)
Pt reports she was never called about her liver bx results. Gave her results, but she asked to f/u to have him discuss them with her; she will come in this afternoon.

## 2013-03-22 NOTE — Progress Notes (Signed)
History of Present Illness: This is a 47 year old Caucasian female with fatty liver confirmed by recent liver biopsy.  She is asymptomatic except for some vague arthritis.  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  There were a Mallory bodies present on the biopsy.    Current Medications, Allergies, Past Medical History, Past Surgical History, Family History and Social History were reviewed in [employer] record.  ROS: All systems were reviewed and are negative unless otherwise stated in the HPI.         Assessment and plan: Fatty liver without any evidence of significant inflammation or scarring or cirrhosis.  I do not think these findings can account for any the patient's arthritic complaints.  She is to keep her weight intact, and I have daily and advised her to try to get her BMI under 35.  She currently weighs 244 pounds with a BMI of 40.6.  Copies noted to Dr. Karma Ganja No diagnosis found.

## 2013-03-22 NOTE — Patient Instructions (Signed)
Please follow up in one year with Dr. Patterson. 

## 2013-06-21 ENCOUNTER — Encounter (INDEPENDENT_AMBULATORY_CARE_PROVIDER_SITE_OTHER): Payer: Self-pay | Admitting: General Surgery

## 2013-06-21 ENCOUNTER — Ambulatory Visit (INDEPENDENT_AMBULATORY_CARE_PROVIDER_SITE_OTHER): Payer: 59 | Admitting: General Surgery

## 2013-06-21 VITALS — BP 124/78 | HR 80 | Temp 98.4°F | Resp 14 | Ht 65.0 in | Wt 245.2 lb

## 2013-06-21 DIAGNOSIS — K439 Ventral hernia without obstruction or gangrene: Secondary | ICD-10-CM

## 2013-06-21 NOTE — Progress Notes (Signed)
Patient ID: Lori Barber, female   DOB: 07-21-1966, 47 y.o.   MRN: [id number]  Chief Complaint  Patient presents with  . New Evaluation    eval ventra hernia/discuss sx    HPI Lori Barber is a 47 y.o. female.  The patient is a 47 year old white female who's had a known ventral hernia near her umbilicus for a number of years. She apparently had an umbilical hernia several years ago which was repaired primarily at the time of a laparoscopic cholecystectomy. The hernia recurred a year or 2 later. She is trying to lose weight and work out more but has been having some discomfort associated with it. She denies any nausea or vomiting. She is ready to have it fixed. HPI  Past Medical History  Diagnosis Date  . Esophagitis, unspecified   . Unspecified gastritis and gastroduodenitis without mention of hemorrhage   . Obesity, unspecified   . Other chronic nonalcoholic liver disease   . Abdominal pain, epigastric   . Anal fissure   . Follicular cyst of ovary   . Unspecified hemorrhoids without mention of complication   . Irritable bowel syndrome   . Personal history of colonic polyps   . Family history of malignant neoplasm of gastrointestinal tract   . Other chronic nonalcoholic liver disease   . Unspecified hypothyroidism   . Type II or unspecified type diabetes mellitus without mention of complication, not stated as uncontrolled   . Esophageal reflux   . Hyperlipidemia   . Abdominal distention   . Anemia   . Anxiety   . Esophageal stricture   . Allergy     mold,dust, animal dander  . Fatty liver     Past Surgical History  Procedure Laterality Date  . Tonsillectomy  1994  . Cesarean section  1999  . Ovarian cyst removal  2002  . Cholecystectomy  2011  . Colonoscopy      Family History  Problem Relation Age of Onset  . Colon cancer Maternal Aunt     cousin  . Cancer Maternal Aunt     colon  . Breast cancer Mother   . Cancer Mother     breast  . Other Mother    car accident  . Heart disease Paternal Grandmother   . Other Father     liver failure  . Cancer Cousin     colon  . Colon cancer Cousin   . Esophageal cancer Maternal Uncle   . Colon cancer Maternal Uncle   . Rectal cancer Neg Hx   . Stomach cancer Neg Hx     Social History History  Substance Use Topics  . Smoking status: Former Games developer  . Smokeless tobacco: Never Used  . Alcohol Use: No     Comment: occasionally    Allergies  Allergen Reactions  . Cleocin [Clindamycin Hcl] Shortness Of Breath    Blood pressure dropping  . Penicillins Anaphylaxis and Swelling  . Mushroom Extract Complex Nausea And Vomiting and Other (See Comments)    Headache   . Amoxicillin Nausea Only  . Clarithromycin Nausea And Vomiting    Current Outpatient Prescriptions  Medication Sig Dispense Refill  . levothyroxine (SYNTHROID, LEVOTHROID) 50 MCG tablet Take 50 mcg by mouth every morning.        No current facility-administered medications for this visit.    Review of Systems Review of Systems  Constitutional: Negative.   HENT: Negative.   Eyes: Negative.   Respiratory: Negative.   Cardiovascular: Negative.  Gastrointestinal: Positive for abdominal pain.  Endocrine: Negative.   Genitourinary: Negative.   Musculoskeletal: Negative.   Skin: Negative.   Allergic/Immunologic: Negative.   Neurological: Negative.   Hematological: Negative.   Psychiatric/Behavioral: Negative.     Blood pressure 124/78, pulse 80, temperature 98.4 F (36.9 C), temperature source Temporal, resp. rate 14, height 5\' 5"  (1.651 m), weight 245 lb 3.2 oz (111.222 kg).  Physical Exam Physical Exam  Constitutional: She is oriented to person, place, and time. She appears well-developed and well-nourished.  HENT:  Head: Normocephalic and atraumatic.  Eyes: Conjunctivae and EOM are normal. Pupils are equal, round, and reactive to light.  Neck: Normal range of motion. Neck supple.  Cardiovascular: Normal rate,  regular rhythm and normal heart sounds.   Pulmonary/Chest: Effort normal and breath sounds normal.  Abdominal: Bowel sounds are normal.  There is a palpable ventral hernia that is easily reducible just inferior to the umbilicus. The fascial edges difficult to palpate because of the size of her abdominal wall.  Musculoskeletal: Normal range of motion.  Neurological: She is alert and oriented to person, place, and time.  Skin: Skin is warm and dry.  Psychiatric: She has a normal mood and affect. Her behavior is normal.    Data Reviewed As above  Assessment    The patient has a moderate size ventral hernia that Has been symptomatic for her. Because of the risk of incarceration and strangulation I think she would benefit from having the hernia fixed. She would also like to have this done. I think she is a good candidate for a laparoscopic type of repair. I've discussed with her in detail the risks and benefits of the operation to fix the hernia as well as some of the technical aspects including the use of mesh and she understands and wishes to proceed    Plan    Plan for laparoscopic ventral hernia repair with mesh        TOTH III,Dmari Schubring S 06/21/2013, 12:23 PM

## 2013-06-21 NOTE — Patient Instructions (Signed)
Plan for lap assisted ventral hernia repair with mesh 

## 2013-07-04 ENCOUNTER — Telehealth: Payer: Self-pay | Admitting: Gastroenterology

## 2013-07-04 NOTE — Telephone Encounter (Signed)
lmom for pt to call back

## 2013-07-05 NOTE — Telephone Encounter (Signed)
Spoke with [facility] and added another dx for Hep C and Hep B labs that were not covered; 571.8 and [facility] were refilled. Informed pt.

## 2013-07-24 ENCOUNTER — Emergency Department (HOSPITAL_COMMUNITY): Payer: 59

## 2013-07-24 ENCOUNTER — Observation Stay (HOSPITAL_COMMUNITY)
Admission: EM | Admit: 2013-07-24 | Discharge: 2013-07-25 | Disposition: A | Discharge status: Home / Self Care | Payer: 59 | Attending: Internal Medicine | Admitting: Internal Medicine

## 2013-07-24 ENCOUNTER — Encounter (HOSPITAL_COMMUNITY): Payer: Self-pay | Admitting: Emergency Medicine

## 2013-07-24 DIAGNOSIS — K649 Unspecified hemorrhoids: Secondary | ICD-10-CM

## 2013-07-24 DIAGNOSIS — Z9889 Other specified postprocedural states: Secondary | ICD-10-CM

## 2013-07-24 DIAGNOSIS — K589 Irritable bowel syndrome without diarrhea: Secondary | ICD-10-CM

## 2013-07-24 DIAGNOSIS — K760 Fatty (change of) liver, not elsewhere classified: Secondary | ICD-10-CM

## 2013-07-24 DIAGNOSIS — K209 Esophagitis, unspecified without bleeding: Secondary | ICD-10-CM

## 2013-07-24 DIAGNOSIS — R002 Palpitations: Secondary | ICD-10-CM

## 2013-07-24 DIAGNOSIS — K219 Gastro-esophageal reflux disease without esophagitis: Secondary | ICD-10-CM | POA: Insufficient documentation

## 2013-07-24 DIAGNOSIS — Z8719 Personal history of other diseases of the digestive system: Secondary | ICD-10-CM

## 2013-07-24 DIAGNOSIS — E669 Obesity, unspecified: Secondary | ICD-10-CM | POA: Insufficient documentation

## 2013-07-24 DIAGNOSIS — R079 Chest pain, unspecified: Principal | ICD-10-CM | POA: Insufficient documentation

## 2013-07-24 DIAGNOSIS — K439 Ventral hernia without obstruction or gangrene: Secondary | ICD-10-CM

## 2013-07-24 DIAGNOSIS — Z9049 Acquired absence of other specified parts of digestive tract: Secondary | ICD-10-CM

## 2013-07-24 DIAGNOSIS — N83201 Unspecified ovarian cyst, right side: Secondary | ICD-10-CM

## 2013-07-24 DIAGNOSIS — E119 Type 2 diabetes mellitus without complications: Secondary | ICD-10-CM | POA: Insufficient documentation

## 2013-07-24 DIAGNOSIS — E039 Hypothyroidism, unspecified: Secondary | ICD-10-CM | POA: Insufficient documentation

## 2013-07-24 DIAGNOSIS — K469 Unspecified abdominal hernia without obstruction or gangrene: Secondary | ICD-10-CM

## 2013-07-24 DIAGNOSIS — K7689 Other specified diseases of liver: Secondary | ICD-10-CM

## 2013-07-24 DIAGNOSIS — M2550 Pain in unspecified joint: Secondary | ICD-10-CM

## 2013-07-24 DIAGNOSIS — Z86010 Personal history of colonic polyps: Secondary | ICD-10-CM

## 2013-07-24 DIAGNOSIS — K2970 Gastritis, unspecified, without bleeding: Secondary | ICD-10-CM

## 2013-07-24 DIAGNOSIS — R1013 Epigastric pain: Secondary | ICD-10-CM

## 2013-07-24 DIAGNOSIS — R110 Nausea: Secondary | ICD-10-CM

## 2013-07-24 DIAGNOSIS — N83209 Unspecified ovarian cyst, unspecified side: Secondary | ICD-10-CM

## 2013-07-24 DIAGNOSIS — R197 Diarrhea, unspecified: Secondary | ICD-10-CM

## 2013-07-24 LAB — CBC WITH DIFFERENTIAL/PLATELET
Basophils Absolute: 0 10*3/uL (ref 0.0–0.1)
Eosinophils Absolute: 0.4 10*3/uL (ref 0.0–0.7)
Eosinophils Relative: 4 % (ref 0–5)
Hemoglobin: 13.6 g/dL (ref 12.0–15.0)
Lymphs Abs: 2.6 10*3/uL (ref 0.7–4.0)
MCH: 30.4 pg (ref 26.0–34.0)
Neutro Abs: 4.8 10*3/uL (ref 1.7–7.7)
Neutrophils Relative %: 57 % (ref 43–77)
Platelets: 296 10*3/uL (ref 150–400)
RBC: 4.47 MIL/uL (ref 3.87–5.11)
WBC: 8.5 10*3/uL (ref 4.0–10.5)

## 2013-07-24 LAB — BASIC METABOLIC PANEL
CO2: 26 mEq/L (ref 19–32)
Calcium: 9.6 mg/dL (ref 8.4–10.5)
Chloride: 104 mEq/L (ref 96–112)
GFR calc Af Amer: >90 mL/min (ref >90)
GFR calc non Af Amer: >90 mL/min (ref >90)
Potassium: 3.9 mEq/L (ref 3.5–5.1)
Sodium: 138 mEq/L (ref 135–145)

## 2013-07-24 LAB — GLUCOSE, CAPILLARY: Glucose-Capillary: 109 mg/dL — ABNORMAL HIGH (ref 70–99)

## 2013-07-24 LAB — TSH: TSH: 1.798 u[IU]/mL (ref 0.350–4.500)

## 2013-07-24 LAB — TROPONIN I
Troponin I: <0.3 ng/mL (ref <0.30)
Troponin I: <0.3 ng/mL (ref <0.30)
Troponin I: <0.3 ng/mL (ref <0.30)

## 2013-07-24 LAB — D-DIMER, QUANTITATIVE: D-Dimer, Quant: <0.27 ug/mL-FEU (ref 0.00–0.48)

## 2013-07-24 MED ORDER — ENOXAPARIN SODIUM 60 MG/0.6ML ~~LOC~~ SOLN
55.0000 mg | SUBCUTANEOUS | Status: DC
  Administered 2013-07-24: 55 mg via SUBCUTANEOUS
  Filled 2013-07-24: qty 0.6

## 2013-07-24 MED ORDER — SODIUM CHLORIDE 0.9 % IJ SOLN
3.0000 mL | Freq: Two times a day (BID) | INTRAMUSCULAR | Status: DC
  Administered 2013-07-24 – 2013-07-25 (×2): 3 mL via INTRAVENOUS

## 2013-07-24 MED ORDER — ACETAMINOPHEN 325 MG PO TABS
650.0000 mg | ORAL_TABLET | Freq: Four times a day (QID) | ORAL | Status: DC | PRN
  Administered 2013-07-24 – 2013-07-25 (×2): 650 mg via ORAL
  Filled 2013-07-24 (×2): qty 2

## 2013-07-24 MED ORDER — SODIUM CHLORIDE 0.9 % IJ SOLN: 3.0000 mL | INTRAMUSCULAR | Status: DC | PRN

## 2013-07-24 MED ORDER — ACETAMINOPHEN 650 MG RE SUPP: 650.0000 mg | Freq: Four times a day (QID) | RECTAL | Status: DC | PRN

## 2013-07-24 MED ORDER — LEVOTHYROXINE SODIUM 50 MCG PO TABS
50.0000 ug | ORAL_TABLET | Freq: Every morning | ORAL | Status: DC
  Administered 2013-07-25: 50 ug via ORAL
  Filled 2013-07-24: qty 1

## 2013-07-24 MED ORDER — ASPIRIN EC 81 MG PO TBEC
81.0000 mg | DELAYED_RELEASE_TABLET | Freq: Every day | ORAL | Status: DC
  Administered 2013-07-24 – 2013-07-25 (×2): 81 mg via ORAL
  Filled 2013-07-24 (×2): qty 1

## 2013-07-24 MED ORDER — PANTOPRAZOLE SODIUM 40 MG PO TBEC
40.0000 mg | DELAYED_RELEASE_TABLET | Freq: Every day | ORAL | Status: DC
  Administered 2013-07-24 – 2013-07-25 (×2): 40 mg via ORAL
  Filled 2013-07-24 (×2): qty 1

## 2013-07-24 MED ORDER — ENOXAPARIN SODIUM 40 MG/0.4ML ~~LOC~~ SOLN: 40.0000 mg | SUBCUTANEOUS | Status: DC

## 2013-07-24 MED ORDER — SODIUM CHLORIDE 0.9 % IJ SOLN
3.0000 mL | Freq: Two times a day (BID) | INTRAMUSCULAR | Status: DC
  Administered 2013-07-24: 3 mL via INTRAVENOUS

## 2013-07-24 MED ORDER — ONDANSETRON HCL 4 MG PO TABS: 4.0000 mg | ORAL_TABLET | Freq: Four times a day (QID) | ORAL | Status: DC | PRN

## 2013-07-24 MED ORDER — ONDANSETRON HCL 4 MG/2ML IJ SOLN: 4.0000 mg | Freq: Four times a day (QID) | INTRAMUSCULAR | Status: DC | PRN

## 2013-07-24 MED ORDER — SODIUM CHLORIDE 0.9 % IV SOLN: 250.0000 mL | INTRAVENOUS | Status: DC | PRN

## 2013-07-24 MED ORDER — ASPIRIN 325 MG PO TABS
325.0000 mg | ORAL_TABLET | Freq: Once | ORAL | Status: AC
  Administered 2013-07-24: 325 mg via ORAL
  Filled 2013-07-24: qty 1

## 2013-07-24 MED ORDER — INSULIN ASPART 100 UNIT/ML ~~LOC~~ SOLN: 0.0000 [IU] | Freq: Three times a day (TID) | SUBCUTANEOUS | Status: DC

## 2013-07-24 NOTE — H&P (Signed)
[facility] History and Physical  Lori Barber WGN:[id number] DOB: 1966/03/06 DOA: 07/24/2013  Referring physician: Dr. Adriana Simas, ER physician PCP: Herb Grays, MD  Specialists:   Chief Complaint: Chest discomfort, palpitations  HPI: Lori Barber is a 47 y.o. female who presents to the emergency room with chest discomfort and palpitations. Symptoms began approximately 3:30am this morning. Patient woke up from sleep and felt like her heart was racing. This episode lasted for 5-10 minutes and resolved on its own. She subsequently developed sharp stabbing substernal chest pain. She also describes as chest pressure. She felt lightheaded on standing. She did not particularly feel short of breath, but did feel a heaviness in her chest. She did not have any vomiting but did experience nausea. Her chest discomfort has not completely resolved since onset. She also reports noticing that she frequently becomes short of breath and diaphoretic and doing her daily chores around the house. She was evaluated in the emergency room her EKG was unremarkable. Cardiac enzymes are negative. Due to the patient's risk factors, she's been referred for further evaluation. She reports undergoing a stress test several years ago with Dr. Donnie Aho, which was reported to be unremarkable.  Review of Systems: Pertinent positives as per history of present illness, otherwise negative  Past Medical History  Diagnosis Date  . Esophagitis, unspecified   . Unspecified gastritis and gastroduodenitis without mention of hemorrhage   . Obesity, unspecified   . Other chronic nonalcoholic liver disease   . Abdominal pain, epigastric   . Anal fissure   . Follicular cyst of ovary   . Unspecified hemorrhoids without mention of complication   . Irritable bowel syndrome   . Personal history of colonic polyps   . Family history of malignant neoplasm of gastrointestinal tract   . Other chronic nonalcoholic liver disease   .  Unspecified hypothyroidism   . Type II or unspecified type diabetes mellitus without mention of complication, not stated as uncontrolled   . Esophageal reflux   . Hyperlipidemia   . Abdominal distention   . Anemia   . Anxiety   . Esophageal stricture   . Allergy     mold,dust, animal dander  . Fatty liver    Past Surgical History  Procedure Laterality Date  . Tonsillectomy  1994  . Cesarean section  1999  . Ovarian cyst removal  2002  . Cholecystectomy  2011  . Colonoscopy    . Abdominal hysterectomy     Social History:  reports that she has quit smoking. She has never used smokeless tobacco. She reports that she does not drink alcohol or use illicit drugs.   Allergies  Allergen Reactions  . Cleocin [Clindamycin Hcl] Shortness Of Breath    Blood pressure dropping  . Penicillins Anaphylaxis and Swelling  . Mushroom Extract Complex Nausea And Vomiting and Other (See Comments)    Headache   . Amoxicillin Nausea Only  . Clarithromycin Nausea And Vomiting    Family History  Problem Relation Age of Onset  . Colon cancer Maternal Aunt     cousin  . Cancer Maternal Aunt     colon  . Breast cancer Mother   . Cancer Mother     breast  . Other Mother     car accident  . Heart disease Paternal Grandmother   . Other Father     liver failure  . Cancer Cousin     colon  . Colon cancer Cousin   . Esophageal cancer Maternal  Uncle   . Colon cancer Maternal Uncle   . Rectal cancer Neg Hx   . Stomach cancer Neg Hx   Brother has arrythmia that required to be shocked  Prior to Admission medications   Medication Sig Start Date End Date Taking? Authorizing Provider  Dexlansoprazole 30 MG capsule Take 30 mg by mouth daily.   Yes Historical Provider, MD  levothyroxine (SYNTHROID, LEVOTHROID) 50 MCG tablet Take 50 mcg by mouth every morning.     Historical Provider, MD   Physical Exam: Filed Vitals:   07/24/13 1546  BP: 123/79  Pulse: 75  Temp: 97.9 F (36.6 C)  Resp: 18      General:  No acute distress  Eyes: Pupils are equal, round, react to light  ENT: Mucous membranes are dry  Neck: Supple  Cardiovascular: S1, S2, regular rate and rhythm, no murmurs gallops or rubs  Respiratory: Clear to auscultation bilaterally  Abdomen: Soft, obese, nontender, positive bowel sounds  Skin: No rashes  Musculoskeletal: No pedal edema bilaterally, no chest wall tenderness on palpation  Psychiatric: Normal affect, cooperative with exam  Neurologic: Grossly intact, nonfocal  Labs on Admission:  Basic Metabolic Panel:  Recent Labs Lab 07/24/13 1058  NA 138  K 3.9  CL 104  CO2 26  GLUCOSE 102*  BUN 10  CREATININE 0.77  CALCIUM 9.6   Liver Function Tests: No results found for this basename: AST, ALT, ALKPHOS, BILITOT, PROT, ALBUMIN,  in the last 168 hours No results found for this basename: LIPASE, AMYLASE,  in the last 168 hours No results found for this basename: AMMONIA,  in the last 168 hours CBC:  Recent Labs Lab 07/24/13 1058  WBC 8.5  NEUTROABS 4.8  HGB 13.6  HCT 40.8  MCV 91.3  PLT 296   Cardiac Enzymes:  Recent Labs Lab 07/24/13 1058  TROPONINI <0.30    BNP (last 3 results) No results found for this basename: PROBNP,  in the last 8760 hours CBG: No results found for this basename: GLUCAP,  in the last 168 hours  Radiological Exams on Admission: Dg Chest 2 View  07/24/2013   CLINICAL DATA:  Rapid heart rate.  EXAM: CHEST  2 VIEW  COMPARISON:  02/27/2011  FINDINGS: The heart size and mediastinal contours are within normal limits. Clear lungs. No pleural effusion or pneumothorax. Mild dextroscoliosis of the thoracic spine, stable.  IMPRESSION: No active cardiopulmonary disease.   Electronically Signed   By: Amie Portland M.D.   On: 07/24/2013 13:17    EKG: Independently reviewed. No acute changes  Assessment/Plan Active Problems:   HYPOTHYROIDISM   GERD   Obesity   Chest pain   Diabetes mellitus type 2,  diet-controlled   1. Chest discomfort. Patient has significant risk factors for coronary artery disease. She will monitor on telemetry, cycle cardiac markers. Obtain 2-D echocardiogram. Repeat EKG in the morning. We'll also request a cardiology consultation to see if she needs stress test prior to discharge. She'll be kept n.p.o. after midnight. Start the patient on aspirin. Check d-dimer. 2. Palpitations. Check TSH, monitor on telemetry, check a 2-D echo. 3. Type 2 diabetes, diet controlled. Check blood sugars and start on sliding scale insulin. 4. GERD. Continue proton pump inhibitor  Code Status: full code Family Communication: discussed with patient Disposition Plan: discharge home once improved  Time spent:  MEMON,JEHANZEB [facility] Pager [phone]   If 7PM-7AM, please contact night-coverage [email or web address] Password [email or web address] 07/24/2013, 4:50 PM

## 2013-07-24 NOTE — ED Notes (Signed)
Pt c/o left sided chest pain that started this am around 3; pt states she woke up with the feeling of her heart beating rapidly and when she got up to go to bathroom she felt dizzy, pt states the pain radiates to her back; pt states she had nausea with the episode; pt states she went to [employer] last night and walked around and when she came home she felt very tired.

## 2013-07-24 NOTE — ED Provider Notes (Signed)
CSN: [id number]     Arrival date & time 07/24/13  1025 History  .This chart was scribed for Donnetta Hutching, MD by Karle Plumber, ED Scribe. This patient was seen in room APA15/APA15 and the patient's care was started at 10:40 AM.  Chief Complaint  Patient presents with  . Chest Pain   HPI HPI Comments:  KARIAH LOREDO is a 47 y.o. obese female with h/o DM who presents to the Emergency Department complaining of sudden onset palpitations and chest pain onset 3:30 AM. Pt states the pain radiates to bilateral shoulder blades. She reports diaphoresis with exertion. She states she feels extreme fatigue. Pt states she experienced heart palpitations on Thursday which resolved on its own and she did not feel well al day. She reports hyperlipidemia but takes no medication for it currently. She reports family h/o MI from both sides of her family. Pt denies smoking. Pt states her PCP is Dr. Herb Grays in [place].  Past Medical History  Diagnosis Date  . Esophagitis, unspecified   . Unspecified gastritis and gastroduodenitis without mention of hemorrhage   . Obesity, unspecified   . Other chronic nonalcoholic liver disease   . Abdominal pain, epigastric   . Anal fissure   . Follicular cyst of ovary   . Unspecified hemorrhoids without mention of complication   . Irritable bowel syndrome   . Personal history of colonic polyps   . Family history of malignant neoplasm of gastrointestinal tract   . Other chronic nonalcoholic liver disease   . Unspecified hypothyroidism   . Type II or unspecified type diabetes mellitus without mention of complication, not stated as uncontrolled   . Esophageal reflux   . Hyperlipidemia   . Abdominal distention   . Anemia   . Anxiety   . Esophageal stricture   . Allergy     mold,dust, animal dander  . Fatty liver    Past Surgical History  Procedure Laterality Date  . Tonsillectomy  1994  . Cesarean section  1999  . Ovarian cyst removal  2002  .  Cholecystectomy  2011  . Colonoscopy     Family History  Problem Relation Age of Onset  . Colon cancer Maternal Aunt     cousin  . Cancer Maternal Aunt     colon  . Breast cancer Mother   . Cancer Mother     breast  . Other Mother     car accident  . Heart disease Paternal Grandmother   . Other Father     liver failure  . Cancer Cousin     colon  . Colon cancer Cousin   . Esophageal cancer Maternal Uncle   . Colon cancer Maternal Uncle   . Rectal cancer Neg Hx   . Stomach cancer Neg Hx    History  Substance Use Topics  . Smoking status: Former Games developer  . Smokeless tobacco: Never Used  . Alcohol Use: No     Comment: occasionally   OB History   Grav Para Term Preterm Abortions TAB SAB Ect Mult Living                 Review of Systems  Constitutional: Positive for diaphoresis.  Cardiovascular: Positive for chest pain (radiates to biilateral shoulder blades) and palpitations.  A complete 10 system review of systems was obtained and all systems are negative except as noted in the HPI and PMH.   Allergies  Cleocin; Penicillins; Mushroom extract complex; Amoxicillin; and Clarithromycin  Home  Medications   Current Outpatient Rx  Name  Route  Sig  Dispense  Refill  . levothyroxine (SYNTHROID, LEVOTHROID) 50 MCG tablet   Oral   Take 50 mcg by mouth every morning.           Triage Vitals: BP 119/80  Pulse 75  Temp(Src) 97.8 F (36.6 C) (Oral)  Resp 18  SpO2 96% Physical Exam  Nursing note and vitals reviewed. Constitutional: She is oriented to person, place, and time. She appears well-developed and well-nourished.  obese  HENT:  Head: Normocephalic and atraumatic.  Eyes: Conjunctivae and EOM are normal. Pupils are equal, round, and reactive to light.  Neck: Normal range of motion. Neck supple.  Cardiovascular: Normal rate, regular rhythm and normal heart sounds.   Pulmonary/Chest: Effort normal and breath sounds normal.  Abdominal: Soft. Bowel sounds are  normal.  Musculoskeletal: Normal range of motion.  Neurological: She is alert and oriented to person, place, and time.  Skin: Skin is warm and dry.  Psychiatric: She has a normal mood and affect.    ED Course  Procedures (including critical care time) DIAGNOSTIC STUDIES: Oxygen Saturation is 96% on RA, adequate by my interpretation.   COORDINATION OF CARE: 10:49 AM- Will obtain lab work. Advised pt of possible Pt verbalizes understanding and agrees to plan.  Medications  aspirin tablet 325 mg (325 mg Oral Given 07/24/13 1134)   Labs Review Labs Reviewed  BASIC METABOLIC PANEL - Abnormal; Notable for the following:    Glucose, Bld 102 (*)    All other components within normal limits  CBC WITH DIFFERENTIAL  TROPONIN I   Imaging Review Dg Chest 2 View  07/24/2013   CLINICAL DATA:  Rapid heart rate.  EXAM: CHEST  2 VIEW  COMPARISON:  02/27/2011  FINDINGS: The heart size and mediastinal contours are within normal limits. Clear lungs. No pleural effusion or pneumothorax. Mild dextroscoliosis of the thoracic spine, stable.  IMPRESSION: No active cardiopulmonary disease.   Electronically Signed   By: Amie Portland M.D.   On: 07/24/2013 13:17    EKG Interpretation     Ventricular Rate:  72 PR Interval:  154 QRS Duration: 94 QT Interval:  374 QTC Calculation: 409 R Axis:   -12 Text Interpretation:  Normal sinus rhythm Normal ECG When compared with ECG of 16-Jan-2010 14:00, No significant change was found            MDM  No diagnosis found. Patient has reasonable history for cardiac related pain with associated risk factors. She is hemodynamically stable. Rx aspirin and 324 mg.  Discussed with general medicine. Admit   I personally performed the services described in this documentation, which was scribed in my presence. The recorded information has been reviewed and is accurate.    Donnetta Hutching, MD 07/24/13 [phone]

## 2013-07-24 NOTE — ED Notes (Signed)
Report called to floor. Pt resting in bed, voices no complaints.

## 2013-07-24 NOTE — Progress Notes (Signed)
Lab called and informed that troponin has not been drawn since 10am. Lab tech stated that she obtained blood work around 4pm but it is not showing up in EPIC as of yet. Lab tech stated that the second troponin was negative.

## 2013-07-25 DIAGNOSIS — R002 Palpitations: Secondary | ICD-10-CM

## 2013-07-25 DIAGNOSIS — E669 Obesity, unspecified: Secondary | ICD-10-CM

## 2013-07-25 LAB — BASIC METABOLIC PANEL
BUN: 11 mg/dL (ref 6–23)
CO2: 27 mEq/L (ref 19–32)
Calcium: 9.2 mg/dL (ref 8.4–10.5)
GFR calc Af Amer: 88 mL/min — ABNORMAL LOW (ref >90)
Glucose, Bld: 110 mg/dL — ABNORMAL HIGH (ref 70–99)
Potassium: 3.8 mEq/L (ref 3.5–5.1)
Sodium: 138 mEq/L (ref 135–145)

## 2013-07-25 LAB — CBC
Hemoglobin: 12.7 g/dL (ref 12.0–15.0)
MCH: 30.3 pg (ref 26.0–34.0)
MCHC: 33.1 g/dL (ref 30.0–36.0)
MCV: 91.6 fL (ref 78.0–100.0)
RBC: 4.19 MIL/uL (ref 3.87–5.11)

## 2013-07-25 LAB — GLUCOSE, CAPILLARY
Glucose-Capillary: 93 mg/dL (ref 70–99)
Glucose-Capillary: 97 mg/dL (ref 70–99)

## 2013-07-25 MED ORDER — HYDROCODONE-ACETAMINOPHEN 5-325 MG PO TABS
1.0000 | ORAL_TABLET | Freq: Four times a day (QID) | ORAL | Status: DC | PRN
  Administered 2013-07-25: 1 via ORAL
  Filled 2013-07-25: qty 1

## 2013-07-25 MED ORDER — ENOXAPARIN SODIUM 60 MG/0.6ML ~~LOC~~ SOLN: 60.0000 mg | SUBCUTANEOUS | Status: DC

## 2013-07-25 MED ORDER — HYDROCODONE-ACETAMINOPHEN 5-325 MG PO TABS: 1.0000 | ORAL_TABLET | Freq: Four times a day (QID) | ORAL | Status: DC | PRN

## 2013-07-25 NOTE — Care Management Note (Signed)
    Page 1 of 1   07/25/2013     11:53:18 AM   CARE MANAGEMENT NOTE 07/25/2013  Patient:  Lori Barber, Lori Barber   Account Number:  [id number]  Date Initiated:  07/25/2013  Documentation initiated by:  Sharrie Rothman  Subjective/Objective Assessment:   Pt admitted from home with CP. Pt lives with her husband and will return home at discharge. Pt is independent with ADL's.     Action/Plan:   No CM needs noted.   Anticipated DC Date:  07/25/2013   Anticipated DC Plan:  HOME/SELF CARE      DC Planning Services  CM consult      Choice offered to / List presented to:             Status of service:  Completed, signed off Medicare Important Message given?   (If response is "NO", the following Medicare IM given date fields will be blank) Date Medicare IM given:   Date Additional Medicare IM given:    Discharge Disposition:  HOME/SELF CARE  Per UR Regulation:    If discussed at Long Length of Stay Meetings, dates discussed:    Comments:  07/25/13 1152 Arlyss Queen, RN BSN CM

## 2013-07-25 NOTE — Discharge Summary (Signed)
Physician Discharge Summary  Lori Barber YNW:[id number] DOB: 01-06-1966 DOA: 07/24/2013  PCP: Herb Grays, MD  Admit date: 07/24/2013 Discharge date: 07/25/2013  Time spent: 40 minutes  Recommendations for Outpatient Follow-up:  1. Follow up with Dr. Collins Scotland, PCP for evaluation of symptoms and discuss scheduling OP sleep study 2. [facility] will contact patient regarding OP stress test and 14 day monitoring  Discharge Diagnoses:  Principal Problem:   Chest pain Active Problems:   HYPOTHYROIDISM   GERD   Obesity   Diabetes mellitus type 2, diet-controlled   Palpitations   Discharge Condition: stable   Diet recommendation: carb modifed  Filed Weights   07/24/13 1546  Weight: 113.9 kg (251 lb 1.7 oz)    History of present illness:  Lori Barber is a 47 y.o. female who presented to the emergency room on 07/24/13 with chest discomfort and palpitations. Symptoms began approximately 3:30am that morning. Patient woke up from sleep and felt like her heart was racing. This episode lasted for 5-10 minutes and resolved on its own. She subsequently developed sharp stabbing substernal chest pain. She also described as chest pressure. She felt lightheaded on standing. She did not particularly feel short of breath, but did feel a heaviness in her chest. She did not have any vomiting but did experience nausea. Her chest discomfort has not completely resolved since onset. She also reported noticing that she frequently becomes short of breath and diaphoretic when doing her daily chores around the house. She was evaluated in the emergency room her EKG was unremarkable. Cardiac enzymes were negative. Due to the patient's risk factors, she'd been referred for further evaluation. She reported undergoing a stress test several years ago with Dr. Donnie Aho, which was reported to be unremarkable.      Hospital Course:  1. Chest discomfort. Atypical.  Admitted to telemetry.  Patient has  significant risk factors for coronary artery disease. No events on tele. Troponin neg x3. D-dimer within the limits of normal. EKG yields NSR with low voltage QRS. Patient evaluated by cardiology who opined patient would benefit OP 2-D echocardiogram with doppler to evaluate for structural heart disease and a stress echocardiogram to evaluate for inducible ischemia. [facility] will contact patient for scheduling.  Continue on aspirin.  2. Palpitations. No further episodes of palpitations per patient. TSH is within the limits of normal. There have been no events on telemetry. [facility] will contact patient to arrange 14 day event monitor to evaluate for tachyarrhythmias. Patient will also see PCP for follow up and possible OP sleep study.  3. Type 2 diabetes, diet controlled. Controlled. CBG range 93-109. HgA1c 5.8.  4. GERD. Continue proton pump inhibitor 5. Obesity: BMI 41.9. Follow up with PCP regarding weight loss and need for OP sleep study given #2. Marland Kitchen   Procedures:  none  Consultations:  Cardiology  Discharge Exam: Filed Vitals:   07/25/13 1400  BP: 116/78  Pulse: 78  Temp: 97.5 F (36.4 C)  Resp: 18    General: Obese sitting in chair. NAD Cardiovascular: S1 and S2, I hear no murmur, gallup or rub. There is trace non-pitting LE edema bilaterally Respiratory: Normal effort. Breath sounds are clear bilaterally. i hear no wheeze or rhonchi  Discharge Instructions  Discharge Orders   Future Appointments Provider Department Dept Phone   08/08/2013 3:00 PM [place] [facility] SAME DAY SURGERY [phone]   Future Orders Complete By Expires   Diet - low sodium heart healthy  As directed    Discharge instructions  As directed    Comments:     Follow up with PCP as directed [facility] will contact you regarding OP stress test and 14 day monitoring.   Increase activity slowly  As directed        Medication List          cyclobenzaprine 10 MG tablet  Commonly known as:  FLEXERIL  Take 1 tablet by mouth daily as needed.     Dexlansoprazole 30 MG capsule  Take 30 mg by mouth daily.     HYDROcodone-acetaminophen 5-325 MG per tablet  Commonly known as:  NORCO/VICODIN  Take 1 tablet by mouth every 6 (six) hours as needed.       Allergies  Allergen Reactions  . Cleocin [Clindamycin Hcl] Anaphylaxis and Shortness Of Breath    Blood pressure dropping  . Penicillins Anaphylaxis and Swelling  . Mushroom Extract Complex Nausea And Vomiting and Other (See Comments)    Headache   . Amoxicillin Nausea Only  . Clarithromycin Nausea And Vomiting       Follow-up Information   Call SPEAR, TAMMY, MD. (To follow up in 1-2 weeks. Discuss scheduling an outpatient sleep study with Dr. Collins Scotland.)    Specialty:  Family Medicine   Contact information:   [address] [place] [address] [phone]       Please follow up. [facility] will contact you for scheduling OP stress test and 14 day monitoring)        The results of significant diagnostics from this hospitalization (including imaging, microbiology, ancillary and laboratory) are listed below for reference.    Significant Diagnostic Studies: Dg Chest 2 View  07/24/2013   CLINICAL DATA:  Rapid heart rate.  EXAM: CHEST  2 VIEW  COMPARISON:  02/27/2011  FINDINGS: The heart size and mediastinal contours are within normal limits. Clear lungs. No pleural effusion or pneumothorax. Mild dextroscoliosis of the thoracic spine, stable.  IMPRESSION: No active cardiopulmonary disease.   Electronically Signed   By: Amie Portland M.D.   On: 07/24/2013 13:17    Microbiology: No results found for this or any previous visit (from the past 240 hour(s)).   Labs: Basic Metabolic Panel:  Recent Labs Lab 07/24/13 1058 07/25/13 0512  NA 138 138  K 3.9 3.8  CL 104 104  CO2 26 27  GLUCOSE 102* 110*  BUN 10 11  CREATININE  0.77 0.88  CALCIUM 9.6 9.2   Liver Function Tests: No results found for this basename: AST, ALT, ALKPHOS, BILITOT, PROT, ALBUMIN,  in the last 168 hours No results found for this basename: LIPASE, AMYLASE,  in the last 168 hours No results found for this basename: AMMONIA,  in the last 168 hours CBC:  Recent Labs Lab 07/24/13 1058 07/25/13 0512  WBC 8.5 8.4  NEUTROABS 4.8  --   HGB 13.6 12.7  HCT 40.8 38.4  MCV 91.3 91.6  PLT 296 281   Cardiac Enzymes:  Recent Labs Lab 07/24/13 1058 07/24/13 1716 07/24/13 2237 07/25/13 0512  TROPONINI <0.30 <0.30 <0.30 <0.30   BNP: BNP (last 3 results) No results found for this basename: PROBNP,  in the last 8760 hours CBG:  Recent Labs Lab 07/24/13 1721 07/24/13 2111 07/25/13 0725 07/25/13 1119  GLUCAP 94 109* 93 97       Signed:  Azariah Latendresse M  [facility] 07/25/2013, 2:35 PM

## 2013-07-25 NOTE — Consult Note (Signed)
The patient was seen and examined, and I agree with the assessment and plan as documented above. Pt admitted with chest pain and palpitations, with normal ECG with normal troponins x 3, normal D-dimer. Discussed various management strategies with patient. Will pursue outpatient 14-day event monitor to evaluate for tachyarrhythmias (SVT, atrial fibrillation, etc). Will also pursue outpatient 2-D echocardiogram with Doppler to evaluate for structural heart disease, and a stress echocardiogram to evaluate for inducible ischemia. Will schedule f/u with me as an outpatient in 4 weeks.

## 2013-07-25 NOTE — Progress Notes (Signed)
07/25/13 1524 Reviewed discharge instructions with patient this afternoon. Given copy of instructions, medication list, prescription, f/u appointment information. Discussed that [facility] to contact her regarding outpatient stress test and heart monitoring. Reviewed chest pain education sheet and when to call MD with patient. Verbalized understanding of instructions  IV site d/c'd and within normal limits. Pt in stable condition awaiting family arrival for discharge home. Earnstine Regal, RN

## 2013-07-25 NOTE — Progress Notes (Signed)
UR chart review completed.  

## 2013-07-25 NOTE — Consult Note (Signed)
CARDIOLOGY CONSULT NOTE   Patient ID: Lori Barber MRN: [id number] DOB/AGE: Aug 12, 1966 47 y.o.  Admit Date: 07/24/2013 Referring Physician: PTH Primary Physician: Herb Grays, MD Consulting Cardiologist: Purvis Sheffield, MD Primary Cardiologist: Donnie Aho MD Reason for Consultation: Chest Pain   Clinical Summary Lori Barber is a 47 y.o.female with no prior cardiac history, admitted with racing heart rate and chest discomfort. She has been seen by Lori Barber cardiologist in the past with stress test which was negative "several years ago." She saw him due to chest discomfort, which was found to be GERD. Other history includes hypothyroidism (not on meds for 3 months), non-alcoholic liver disease, type II diabetes, obesity and anxiety.    She states that she had racing HR which awakened her 4 days ago in the middle of the night lasting 5 minutes, with associated pinching chest pain.She got up and walked around and this was relieved. For a few days following this event, she had episodes of significant diaphoresis, and fatigue while cleaning her house or exerting herself. Resting in front of a fan helped, but symptoms returned again. In the early morning hours of Sunday, she again awoke with rapid HR and chest discomfort, pinching, sharp, radiating her to back with pain in the right shoulder. Mild diaphoresis, and generally not feeling well. SDhe admits to drinking about 30 oz of caffeine daily with coffee and tea.   She presented to ER with HR of 75 bpm, BP 119/80. Cardiac markers are normal. CXR negative for CHF or pneumonia. EKG normal without evidence of WPW. TSH 1.798. She is now without complaint with the exception of a headache.    Allergies  Allergen Reactions  . Cleocin [Clindamycin Hcl] Anaphylaxis and Shortness Of Breath    Blood pressure dropping  . Penicillins Anaphylaxis and Swelling  . Mushroom Extract Complex Nausea And Vomiting and Other (See Comments)    Headache   .  Amoxicillin Nausea Only  . Clarithromycin Nausea And Vomiting    Medications Scheduled Medications: . aspirin EC  81 mg Oral Daily  . enoxaparin (LOVENOX) injection  60 mg Subcutaneous Q24H  . insulin aspart  0-9 Units Subcutaneous TID WC  . levothyroxine  50 mcg Oral q morning - 10a  . pantoprazole  40 mg Oral Daily  . sodium chloride  3 mL Intravenous Q12H  . sodium chloride  3 mL Intravenous Q12H         PRN Medications:  sodium chloride, acetaminophen, acetaminophen, ondansetron (ZOFRAN) IV, ondansetron, sodium chloride   Past Medical History  Diagnosis Date  . Esophagitis, unspecified   . Unspecified gastritis and gastroduodenitis without mention of hemorrhage   . Obesity, unspecified   . Other chronic nonalcoholic liver disease   . Abdominal pain, epigastric   . Anal fissure   . Follicular cyst of ovary   . Unspecified hemorrhoids without mention of complication   . Irritable bowel syndrome   . Personal history of colonic polyps   . Family history of malignant neoplasm of gastrointestinal tract   . Other chronic nonalcoholic liver disease   . Unspecified hypothyroidism   . Type II or unspecified type diabetes mellitus without mention of complication, not stated as uncontrolled   . Esophageal reflux   . Hyperlipidemia   . Abdominal distention   . Anemia   . Anxiety   . Esophageal stricture   . Allergy     mold,dust, animal dander  . Fatty liver     Past Surgical History  Procedure  Laterality Date  . Tonsillectomy  1994  . Cesarean section  1999  . Ovarian cyst removal  2002  . Cholecystectomy  2011  . Colonoscopy    . Abdominal hysterectomy      Family History  Problem Relation Age of Onset  . Colon cancer Maternal Aunt     cousin  . Cancer Maternal Aunt     colon  . Breast cancer Mother   . Cancer Mother     breast  . Other Mother     car accident  . Heart disease Paternal Grandmother   . Other Father     liver failure  . Cancer Cousin      colon  . Colon cancer Cousin   . Esophageal cancer Maternal Uncle   . Colon cancer Maternal Uncle   . Rectal cancer Neg Hx   . Stomach cancer Neg Hx     Social History Lori Barber reports that she has quit smoking. She has never used smokeless tobacco. Lori Barber reports that she does not drink alcohol.  Review of Systems Otherwise reviewed and negative except as outlined.  Physical Examination Blood pressure 116/78, pulse 63, temperature 97.6 F (36.4 C), temperature source Oral, resp. rate 18, height 5\' 5"  (1.651 m), weight 251 lb 1.7 oz (113.9 kg), SpO2 97.00%.  Intake/Output Summary (Last 24 hours) at 07/25/13 0929 Last data filed at 07/25/13 0848  Gross per 24 hour  Intake      3 ml  Output      0 ml  Net      3 ml    Telemetry: NSR with one episode of HR 112 bpm at 6 pm last evening.  HEENT: Conjunctiva and lids normal, oropharynx clear with moist mucosa. Neck: Supple, no elevated JVP or carotid bruits, no thyromegaly. Lungs: Clear to auscultation, nonlabored breathing at rest. Cardiac: Regular rate and rhythm, no S3 or significant systolic murmur, no pericardial rub. Abdomen: Soft, nontender, no hepatomegaly, bowel sounds present, no guarding or rebound. Extremities: No pitting edema, distal pulses 2+. Skin: Warm and dry. Musculoskeletal: No kyphosis. Neuropsychiatric: Alert and oriented x3, affect grossly appropriate.  Prior Cardiac Testing/Procedures: None available.  Lab Results  Basic Metabolic Panel:  Recent Labs Lab 07/24/13 1058 07/25/13 0512  NA 138 138  K 3.9 3.8  CL 104 104  CO2 26 27  GLUCOSE 102* 110*  BUN 10 11  CREATININE 0.77 0.88  CALCIUM 9.6 9.2   CBC:  Recent Labs Lab 07/24/13 1058 07/25/13 0512  WBC 8.5 8.4  NEUTROABS 4.8  --   HGB 13.6 12.7  HCT 40.8 38.4  MCV 91.3 91.6  PLT 296 281    Cardiac Enzymes:  Recent Labs Lab 07/24/13 1058 07/24/13 1716 07/24/13 2237 07/25/13 0512  TROPONINI <0.30 <0.30 <0.30  <0.30     Radiology: Dg Chest 2 View  07/24/2013   CLINICAL DATA:  Rapid heart rate.  EXAM: CHEST  2 VIEW  COMPARISON:  02/27/2011  FINDINGS: The heart size and mediastinal contours are within normal limits. Clear lungs. No pleural effusion or pneumothorax. Mild dextroscoliosis of the thoracic spine, stable.  IMPRESSION: No active cardiopulmonary disease.   Electronically Signed   By: Amie Portland M.D.   On: 07/24/2013 13:17     ECG: NSR rate of 62 bpm   Impression and Recommendations  1. Elevated HR: No evidence of WPW on EKG. Cannot rule out PSVT or atrial fib at this time, but there is no documentation on telemetry  or prior EKG to confirm. She is not on any AV nodal blocking agents. TSH is normal. Consider OP cardiac monitoring.  2. Chest Pain: Atypical with pinching, sharp midsternal radiating to her back with right shoulder pain.  Normal EKG and normal cardiac markers. Consider OP/IP stress cardiolite with CVRF of obesity, DM type II, thyroid disease,   3. Nocturnal heart racing with shortness of breath: Consider sleep study for OSA.  4. Obesity: BMI 41.9. Weight loss and increased exercise is recommended.  5. Diabetes Type II: She is diet controlled currently, with normal glucose this admission.        Signed: Bettey Mare. Lyman Bishop NP [facility] 07/25/2013, 9:29 AM Co-Sign MD

## 2013-07-25 NOTE — Progress Notes (Signed)
07/25/13 1337 Patient c/o persistent headache, rated 8/10 on scale of 0-10 despite having tylenol 650 mg po as ordered for PRN pain this morning at 1141. Requested something else for headache if possible. Notified Toya Smothers, NP, states will address. Earnstine Regal, RN

## 2013-07-25 NOTE — Progress Notes (Signed)
Agree with findings, assessment, and plan. See following discharge summary.

## 2013-07-25 NOTE — Plan of Care (Signed)
Problem: Phase I Progression Outcomes Goal: Anginal pain relieved Outcome: Completed/Met Date Met:  07/25/13 07/25/13 1117 Patient denies chest pain this morning. Earnstine Regal, RN

## 2013-07-25 NOTE — Progress Notes (Signed)
07/25/13 1555 Patient left floor in stable condition via w/c accompanied by nurse tech. Discharged home. Earnstine Regal, RN

## 2013-07-25 NOTE — Discharge Summary (Signed)
The patient was seen and examined. She was discussed with nurse practitioner, Ms. Vedia Coffer. Agree with assessment and plan. The patient denies palpitations or chest pain currently. Her heart rate was a little elevated, but her TSH is within normal limits. Her diabetes appear to be well controlled. She may benefit from an outpatient sleep study as she has a history of snoring per her account. Otherwise, she will followup with cardiology for further outpatient monitoring with an event monitor and 2-D echocardiogram with Doppler to evaluate for structural heart disease and a stress echocardiogram to evaluate for inducible ischemia.

## 2013-07-25 NOTE — Progress Notes (Signed)
[facility] PROGRESS NOTE  Lori Barber:[id number] DOB: May 16, 1966 DOA: 07/24/2013 PCP: Herb Grays, MD  Assessment/Plan: 1. Chest discomfort. Continues with mild left anterior CP.  Patient has significant risk factors for coronary artery disease. No events on tele. Troponin neg x3. D-dimer within the limits of normal. We will await 2-D echocardiogram results. Repeat EKG yields NSR with low voltage QRS. Await cardiology consultation to see if she needs stress test prior to discharge. She has been n.p.o. after midnight. Continue on aspirin.  2. Palpitations. No further episodes of palpitations per patient.  TSH is within the limits of normal. There have been no events on telemetry.  3. Type 2 diabetes, diet controlled. Controlled.  CBG range 93-109. HgA1c 5.8. Will continue carb modified diet 4. GERD. Continue proton pump inhibitor 5. Obesity: BMI 41.9. Will request nutritional consult.   Code Status: full Family Communication: none present Disposition Plan: home when ready.   Consultants:  Cardiology  Procedures:  none  Antibiotics: none HPI/Subjective: Sitting in chair reading.   Objective: Filed Vitals:   07/25/13 0419  BP: 116/78  Pulse: 63  Temp: 97.6 F (36.4 C)  Resp: 18    Intake/Output Summary (Last 24 hours) at 07/25/13 0918 Last data filed at 07/25/13 0848  Gross per 24 hour  Intake      3 ml  Output      0 ml  Net      3 ml   Filed Weights   07/24/13 1546  Weight: 113.9 kg (251 lb 1.7 oz)    Exam:   General:  Obese, calm  Cardiovascular: S1, S2. Regular rate and rhythm. i hear no gallup or rub. Trace LE non-pitting edema  Respiratory: normal effort. Breath sounds are clear bilaterally to auscultation. No wheeze no rhonchi  Abdomen: obese. Abdomen is soft with +BS in all 4 quadrants. There is no guarding or rebound. Abdomen is non-tender to palpation  Musculoskeletal: there is no clubbing and no cyanosis. Ambulates in room with  steady gait.    Data Reviewed: Basic Metabolic Panel:  Recent Labs Lab 07/24/13 1058 07/25/13 0512  NA 138 138  K 3.9 3.8  CL 104 104  CO2 26 27  GLUCOSE 102* 110*  BUN 10 11  CREATININE 0.77 0.88  CALCIUM 9.6 9.2   Liver Function Tests: No results found for this basename: AST, ALT, ALKPHOS, BILITOT, PROT, ALBUMIN,  in the last 168 hours No results found for this basename: LIPASE, AMYLASE,  in the last 168 hours No results found for this basename: AMMONIA,  in the last 168 hours CBC:  Recent Labs Lab 07/24/13 1058 07/25/13 0512  WBC 8.5 8.4  NEUTROABS 4.8  --   HGB 13.6 12.7  HCT 40.8 38.4  MCV 91.3 91.6  PLT 296 281   Cardiac Enzymes:  Recent Labs Lab 07/24/13 1058 07/24/13 1716 07/24/13 2237 07/25/13 0512  TROPONINI <0.30 <0.30 <0.30 <0.30   BNP (last 3 results) No results found for this basename: PROBNP,  in the last 8760 hours CBG:  Recent Labs Lab 07/24/13 1721 07/24/13 2111 07/25/13 0725  GLUCAP 94 109* 93    No results found for this or any previous visit (from the past 240 hour(s)).   Studies: Dg Chest 2 View  07/24/2013   CLINICAL DATA:  Rapid heart rate.  EXAM: CHEST  2 VIEW  COMPARISON:  02/27/2011  FINDINGS: The heart size and mediastinal contours are within normal limits. Clear lungs. No pleural effusion or pneumothorax.  Mild dextroscoliosis of the thoracic spine, stable.  IMPRESSION: No active cardiopulmonary disease.   Electronically Signed   By: Amie Portland M.D.   On: 07/24/2013 13:17    Scheduled Meds: . aspirin EC  81 mg Oral Daily  . enoxaparin (LOVENOX) injection  60 mg Subcutaneous Q24H  . insulin aspart  0-9 Units Subcutaneous TID WC  . levothyroxine  50 mcg Oral q morning - 10a  . pantoprazole  40 mg Oral Daily  . sodium chloride  3 mL Intravenous Q12H  . sodium chloride  3 mL Intravenous Q12H   Continuous Infusions:   Principal Problem:   Chest pain Active Problems:   HYPOTHYROIDISM   GERD   Obesity   Diabetes  mellitus type 2, diet-controlled   Palpitations    Time spent: 30 minutes    [facility] M  [facility] Pager [phone]. If 7PM-7AM, please contact night-coverage at [email or web address], password [facility] 07/25/2013, 9:18 AM  LOS: 1 day

## 2013-08-01 ENCOUNTER — Encounter: Payer: Self-pay | Admitting: Cardiology

## 2013-08-01 ENCOUNTER — Ambulatory Visit (INDEPENDENT_AMBULATORY_CARE_PROVIDER_SITE_OTHER): Payer: 59 | Admitting: Cardiology

## 2013-08-01 VITALS — BP 133/83 | HR 79 | Ht 65.0 in | Wt 246.0 lb

## 2013-08-01 DIAGNOSIS — R079 Chest pain, unspecified: Secondary | ICD-10-CM

## 2013-08-01 DIAGNOSIS — R002 Palpitations: Secondary | ICD-10-CM

## 2013-08-01 NOTE — Patient Instructions (Signed)
Your physician recommends that you schedule a follow-up appointment in: 2 weeks with Dr. Wyline Mood in [place] office. This appointment will be scheduled today before you leave.  Your physician has requested that you have an echocardiogram. Echocardiography is a painless test that uses sound waves to create images of your heart. It provides your doctor with information about the size and shape of your heart and how well your heart's chambers and valves are working. This procedure takes approximately one hour. There are no restrictions for this procedure.  Your physician has requested that you have a stress echocardiogram. For further information please visit [email or web address] Please follow instruction sheet as given.  Your physician recommends that you continue on your current medications as directed. Please refer to the Current Medication list given to you today.

## 2013-08-01 NOTE — Progress Notes (Signed)
Clinical Summary [name] is a 47 y.o.female referred for cardiac preoperative evaluation prior to ventral hernia repair    1. Palpitations - notes palpitations started earlier this October, denies any significant symptoms before. - was sleeping, feeling of heart racing. + SOB. Mild chest discomfort. Stood up and walked to kitchen to get some water, symptoms resolved on own. Sharp pain 9/10 in mid chest to left shoulder blade, and then right shoulder blade. Pain lasted 8 hours. Pain somewhat worst with deep breaths.  -presented to [facility] 07/2013. In ER pulse 75 with blood pressure 119/80, negative cardiac enzymes, TSH 1.8. No events on telemetry. Discharged with outpatient follow up  Repeat episode after being in hospital, woke up with palpitations that lasted 5-10 minutes that resolved on its own. No chest pain during this episode.   - fairly sedentary lifestyle, highest level of activity is cleaning her house. Has noted some increased fatigue and diaphoresis with activities. No palpitations at this time. Drinks 2-3 cups of coffee in morning, can of soda in the morning.   CAD risk factors: tobacco, prediabetes, obesity  Past Medical History  Diagnosis Date  . Esophagitis, unspecified   . Unspecified gastritis and gastroduodenitis without mention of hemorrhage   . Obesity, unspecified   . Other chronic nonalcoholic liver disease   . Abdominal pain, epigastric   . Anal fissure   . Follicular cyst of ovary   . Unspecified hemorrhoids without mention of complication   . Irritable bowel syndrome   . Personal history of colonic polyps   . Family history of malignant neoplasm of gastrointestinal tract   . Other chronic nonalcoholic liver disease   . Unspecified hypothyroidism   . Type II or unspecified type diabetes mellitus without mention of complication, not stated as uncontrolled   . Esophageal reflux   . Hyperlipidemia   . Abdominal distention   . Anemia   . Anxiety    . Esophageal stricture   . Allergy     mold,dust, animal dander  . Fatty liver      Allergies  Allergen Reactions  . Cleocin [Clindamycin Hcl] Anaphylaxis and Shortness Of Breath    Blood pressure dropping  . Penicillins Anaphylaxis and Swelling  . Mushroom Extract Complex Nausea And Vomiting and Other (See Comments)    Headache   . Amoxicillin Nausea Only  . Clarithromycin Nausea And Vomiting     Current Outpatient Prescriptions  Medication Sig Dispense Refill  . cyclobenzaprine (FLEXERIL) 10 MG tablet Take 1 tablet by mouth daily as needed.      Marland Kitchen Dexlansoprazole 30 MG capsule Take 30 mg by mouth daily.      Marland Kitchen HYDROcodone-acetaminophen (NORCO/VICODIN) 5-325 MG per tablet Take 1 tablet by mouth every 6 (six) hours as needed.  8 tablet  0   No current facility-administered medications for this visit.     Past Surgical History  Procedure Laterality Date  . Tonsillectomy  1994  . Cesarean section  1999  . Ovarian cyst removal  2002  . Cholecystectomy  2011  . Colonoscopy    . Abdominal hysterectomy       Allergies  Allergen Reactions  . Cleocin [Clindamycin Hcl] Anaphylaxis and Shortness Of Breath    Blood pressure dropping  . Penicillins Anaphylaxis and Swelling  . Mushroom Extract Complex Nausea And Vomiting and Other (See Comments)    Headache   . Amoxicillin Nausea Only  . Clarithromycin Nausea And Vomiting  Family History  Problem Relation Age of Onset  . Colon cancer Maternal Aunt     cousin  . Cancer Maternal Aunt     colon  . Breast cancer Mother   . Cancer Mother     breast  . Other Mother     car accident  . Heart disease Paternal Grandmother   . Other Father     liver failure  . Cancer Cousin     colon  . Colon cancer Cousin   . Esophageal cancer Maternal Uncle   . Colon cancer Maternal Uncle   . Rectal cancer Neg Hx   . Stomach cancer Neg Hx      Social History Lori Barber reports that she has quit smoking. She has never  used smokeless tobacco. Lori Barber reports that she does not drink alcohol.   Review of Systems CONSTITUTIONAL: No weight loss, fever, chills, weakness or fatigue.  HEENT: Eyes: No visual loss, blurred vision, double vision or yellow sclerae.No hearing loss, sneezing, congestion, runny nose or sore throat.  SKIN: No rash or itching.  CARDIOVASCULAR: per HPI RESPIRATORY: No shortness of breath, cough or sputum.  GASTROINTESTINAL: No anorexia, nausea, vomiting or diarrhea. No abdominal pain or blood.  GENITOURINARY: No burning on urination, no polyuria NEUROLOGICAL: No headache, dizziness, syncope, paralysis, ataxia, numbness or tingling in the extremities. No change in bowel or bladder control.  MUSCULOSKELETAL: No muscle, back pain, joint pain or stiffness.  LYMPHATICS: No enlarged nodes. No history of splenectomy.  PSYCHIATRIC: No history of depression or anxiety.  ENDOCRINOLOGIC: No reports of sweating, cold or heat intolerance. No polyuria or polydipsia.  Marland Kitchen   Physical Examination p 79 bp 133/83 Wt 246 lbs BMI 41 Gen: resting comfortably, no acute distress HEENT: no scleral icterus, pupils equal round and reactive, no palptable cervical adenopathy,  CV: RRR, no m/r/g, no JVD, no carotid bruits Resp: Clear to auscultation bilaterally GI: abdomen is soft, non-tender, non-distended, normal bowel sounds, no hepatosplenomegaly MSK: extremities are warm, no edema.  Skin: warm, no rash Neuro:  no focal deficits Psych: appropriate affect     Assessment and Plan  1. Chest pain/palpitations - multiple episodes this month - will obtain echo and stress echo to evaluate for structural heart disease in the setting of chest pain and palpitations. She also described increased fatigue with activities. - further workup pending initial results.       Antoine Poche, M.D., F.A.C.C.

## 2013-08-08 ENCOUNTER — Other Ambulatory Visit (HOSPITAL_COMMUNITY): Payer: 59

## 2013-08-15 ENCOUNTER — Ambulatory Visit (HOSPITAL_COMMUNITY): Admission: RE | Admit: 2013-08-15 | Payer: 59 | Source: Ambulatory Visit | Admitting: General Surgery

## 2013-08-15 ENCOUNTER — Encounter (HOSPITAL_COMMUNITY): Admission: RE | Payer: Self-pay | Source: Ambulatory Visit

## 2013-08-15 SURGERY — REPAIR, HERNIA, VENTRAL, LAPAROSCOPIC
Anesthesia: General

## 2013-08-16 ENCOUNTER — Ambulatory Visit (HOSPITAL_COMMUNITY)
Admission: RE | Admit: 2013-08-16 | Discharge: 2013-08-16 | Disposition: A | Discharge status: Home / Self Care | Payer: 59 | Source: Ambulatory Visit | Attending: Cardiology | Admitting: Cardiology

## 2013-08-16 ENCOUNTER — Encounter (HOSPITAL_COMMUNITY): Payer: Self-pay

## 2013-08-16 DIAGNOSIS — Z87891 Personal history of nicotine dependence: Secondary | ICD-10-CM | POA: Insufficient documentation

## 2013-08-16 DIAGNOSIS — Z6841 Body Mass Index (BMI) 40.0 and over, adult: Secondary | ICD-10-CM | POA: Insufficient documentation

## 2013-08-16 DIAGNOSIS — R079 Chest pain, unspecified: Secondary | ICD-10-CM | POA: Insufficient documentation

## 2013-08-16 DIAGNOSIS — R002 Palpitations: Secondary | ICD-10-CM | POA: Insufficient documentation

## 2013-08-16 DIAGNOSIS — E669 Obesity, unspecified: Secondary | ICD-10-CM | POA: Insufficient documentation

## 2013-08-16 DIAGNOSIS — R072 Precordial pain: Secondary | ICD-10-CM

## 2013-08-16 DIAGNOSIS — I517 Cardiomegaly: Secondary | ICD-10-CM

## 2013-08-16 DIAGNOSIS — E119 Type 2 diabetes mellitus without complications: Secondary | ICD-10-CM | POA: Insufficient documentation

## 2013-08-16 NOTE — Progress Notes (Signed)
Stress Lab Nurses Notes - Lori Barber  Lori Barber 08/16/2013 Reason for doing test: Chest Pain Type of test: Stress Echo Nurse performing test: Parke Poisson, RN Nuclear Medicine Tech: Not Applicable Echo Tech: Veda Canning MD performing test: Branch / K.Lawrence NP Family MD: Dr. Collins Scotland Test explained and consent signed: yes IV started: No IV started Symptoms: SOB & leg fatigue Treatment/Intervention: None Reason test stopped: fatigue and reached target HR After recovery IV was: NA Patient to return to Nuc. Med at : NA Patient discharged: Home Patient's Condition upon discharge was: stable Comments: During test peak BP 168/61 & HR 176. Recovery BP 105/67 & HR 102.  Symptoms resolved in recovery. Erskine Speed T

## 2013-08-16 NOTE — Progress Notes (Signed)
*  PRELIMINARY RESULTS* Echocardiogram 2D Echocardiogram has been performed.  Dianah Pruett 08/16/2013, 11:10 AM

## 2013-08-16 NOTE — Progress Notes (Signed)
*  PRELIMINARY RESULTS* Echocardiogram Echocardiogram Stress Test has been performed.  Arasely Akkerman 08/16/2013, 11:58 AM

## 2013-08-18 ENCOUNTER — Telehealth: Payer: Self-pay | Admitting: Cardiology

## 2013-08-18 NOTE — Telephone Encounter (Signed)
Called pt but pt did not answer.

## 2013-08-18 NOTE — Telephone Encounter (Signed)
Message copied by Burnice Logan on Thu Aug 18, 2013  3:49 PM ------      Message from: [place] F      Created: Wed Aug 17, 2013  4:53 PM       Please let patient know her stress test was normal. Her echo shows normal pumping function, but her heart is just a little bit stiff, this is a pattern we usually see in patients with hypertension. We will discuss everything in further detail at our next visit.                  Dina Rich MD ------

## 2013-08-19 NOTE — Telephone Encounter (Signed)
Pt informed of results and confirmed appt for Monday.

## 2013-08-19 NOTE — Telephone Encounter (Signed)
Attempted to call pt again no answer  

## 2013-08-22 ENCOUNTER — Encounter: Payer: Self-pay | Admitting: Cardiology

## 2013-08-22 ENCOUNTER — Ambulatory Visit (INDEPENDENT_AMBULATORY_CARE_PROVIDER_SITE_OTHER): Payer: 59 | Admitting: Cardiology

## 2013-08-22 VITALS — BP 121/83 | HR 71 | Ht 65.0 in | Wt 248.1 lb

## 2013-08-22 DIAGNOSIS — G4710 Hypersomnia, unspecified: Secondary | ICD-10-CM

## 2013-08-22 DIAGNOSIS — R002 Palpitations: Secondary | ICD-10-CM

## 2013-08-22 DIAGNOSIS — R079 Chest pain, unspecified: Secondary | ICD-10-CM

## 2013-08-22 NOTE — Progress Notes (Signed)
Clinical Summary Lori Barber is a 47 y.o.female seen today for follow up of the following problems.   1. Palpitations  - notes palpitations started earlier this October, denies any significant symptoms before.  - was sleeping, feeling of heart racing. + SOB. Mild chest discomfort. Stood up and walked to kitchen to get some water, symptoms resolved on own. Sharp pain 9/10 in mid chest to left shoulder blade, and then right shoulder blade. Pain lasted 8 hours. Pain somewhat worst with deep breaths.  -presented to [facility] 07/2013. In ER pulse 75 with blood pressure 119/80, negative cardiac enzymes, TSH 1.8. No events on telemetry. Discharged with outpatient follow up  Repeat episode after being in hospital, woke up with palpitations that lasted 5-10 minutes that resolved on its own. No chest pain during this episode.  - fairly sedentary lifestyle, highest level of activity is cleaning her house. Has noted some increased fatigue and diaphoresis with activities.   - no recent symptoms since last visit. Cut back on caffeine down to 1 cup a day, caffeine free soda. She also completed her echo and stress test.   2. Hypersomnolence - reports history of daytime somnolence. She also has a history of snoring. Denies ever having a sleep study before.    Past Medical History  Diagnosis Date  . Esophagitis, unspecified   . Unspecified gastritis and gastroduodenitis without mention of hemorrhage   . Obesity, unspecified   . Other chronic nonalcoholic liver disease   . Abdominal pain, epigastric   . Anal fissure   . Follicular cyst of ovary   . Unspecified hemorrhoids without mention of complication   . Irritable bowel syndrome   . Personal history of colonic polyps   . Family history of malignant neoplasm of gastrointestinal tract   . Other chronic nonalcoholic liver disease   . Unspecified hypothyroidism   . Type II or unspecified type diabetes mellitus without mention of complication, not  stated as uncontrolled   . Esophageal reflux   . Hyperlipidemia   . Abdominal distention   . Anemia   . Anxiety   . Esophageal stricture   . Allergy     mold,dust, animal dander  . Fatty liver      Allergies  Allergen Reactions  . Cleocin [Clindamycin Hcl] Anaphylaxis and Shortness Of Breath    Blood pressure dropping  . Penicillins Anaphylaxis and Swelling  . Mushroom Extract Complex Nausea And Vomiting and Other (See Comments)    Headache   . Amoxicillin Nausea Only  . Clarithromycin Nausea And Vomiting     Current Outpatient Prescriptions  Medication Sig Dispense Refill  . cyclobenzaprine (FLEXERIL) 10 MG tablet Take 1 tablet by mouth daily as needed.      Marland Kitchen Dexlansoprazole 30 MG capsule Take 30 mg by mouth daily.      Marland Kitchen HYDROcodone-acetaminophen (NORCO/VICODIN) 5-325 MG per tablet Take 1 tablet by mouth every 6 (six) hours as needed.  8 tablet  0   No current facility-administered medications for this visit.     Past Surgical History  Procedure Laterality Date  . Tonsillectomy  1994  . Cesarean section  1999  . Ovarian cyst removal  2002  . Cholecystectomy  2011  . Colonoscopy    . Abdominal hysterectomy       Allergies  Allergen Reactions  . Cleocin [Clindamycin Hcl] Anaphylaxis and Shortness Of Breath    Blood pressure dropping  . Penicillins Anaphylaxis and Swelling  . Mushroom Extract  Complex Nausea And Vomiting and Other (See Comments)    Headache   . Amoxicillin Nausea Only  . Clarithromycin Nausea And Vomiting      Family History  Problem Relation Age of Onset  . Colon cancer Maternal Aunt     cousin  . Cancer Maternal Aunt     colon  . Breast cancer Mother   . Cancer Mother     breast  . Other Mother     car accident  . Heart disease Paternal Grandmother   . Other Father     liver failure  . Cancer Cousin     colon  . Colon cancer Cousin   . Esophageal cancer Maternal Uncle   . Colon cancer Maternal Uncle   . Rectal cancer  Neg Hx   . Stomach cancer Neg Hx      Social History Lori Barber reports that she has quit smoking. She has never used smokeless tobacco. Lori Barber reports that she does not drink alcohol.   Review of Systems CONSTITUTIONAL: No weight loss, fever, chills, weakness  HEENT: Eyes: No visual loss, blurred vision, double vision or yellow sclerae.No hearing loss, sneezing, congestion, runny nose or sore throat.  SKIN: No rash or itching.  CARDIOVASCULAR: per HPI RESPIRATORY: per HPI.  GASTROINTESTINAL: No anorexia, nausea, vomiting or diarrhea. No abdominal pain or blood.  GENITOURINARY: No burning on urination, no polyuria NEUROLOGICAL: No headache, dizziness, syncope, paralysis, ataxia, numbness or tingling in the extremities. No change in bowel or bladder control.  MUSCULOSKELETAL: No muscle, back pain, joint pain or stiffness.  LYMPHATICS: No enlarged nodes. No history of splenectomy.  PSYCHIATRIC: No history of depression or anxiety.  ENDOCRINOLOGIC: No reports of sweating, cold or heat intolerance. No polyuria or polydipsia.  Marland Kitchen   Physical Examination p 71 bp 121/83 Wt 248 lbs BMI 41 Gen: resting comfortably, no acute distress HEENT: no scleral icterus, pupils equal round and reactive, no palptable cervical adenopathy,  CV: RRR, no m/r/g, no JVD, no carotid bruits Resp: Clear to auscultation bilaterally GI: abdomen is soft, non-tender, non-distended, normal bowel sounds, no hepatosplenomegaly MSK: extremities are warm, no edema.  Skin: warm, no rash Neuro:  no focal deficits Psych: appropriate affect   Diagnostic Studies Echo 08/16/13: LVEF 60-65%, mild LVH, grade II diastolic dysfunction,   08/16/13 Stress echo: Negative for ischemia.     Assessment and Plan   1. Chest pain/palpitations  - negative stress test, no recent symptoms - if comes back, consider event monitor to evaluate for symptomatic arrhythmia.   2. Hypersomnolence - patient with multiple OSA risk  factors including extreme obesity, history of snoring, and day time somnolence. Though she does not have a diagnosis of hypertension she does have evidence of diastolic dysfunction on echo - refer for sleep study      Antoine Poche, M.D., F.A.C.C.

## 2013-08-22 NOTE — Patient Instructions (Signed)
Your physician recommends that you schedule a follow-up appointment in: 1 year with Dr. Wyline Mood. You should receive a letter in the mail in 10 months. If you do not receive this letter by September 2015 call our office to schedule this appointment.   Your physician recommends that you continue on your current medications as directed. Please refer to the Current Medication list given to you today.  You have been referred to Dr. Andrey Campanile.

## 2013-08-23 ENCOUNTER — Encounter: Payer: Self-pay | Admitting: Cardiology

## 2013-08-23 ENCOUNTER — Encounter (INDEPENDENT_AMBULATORY_CARE_PROVIDER_SITE_OTHER): Payer: Self-pay

## 2013-08-25 ENCOUNTER — Telehealth (INDEPENDENT_AMBULATORY_CARE_PROVIDER_SITE_OTHER): Payer: Self-pay | Admitting: *Deleted

## 2013-08-25 NOTE — Telephone Encounter (Signed)
Patient called to state that she has now been cleared by cardiology and wants to move forward with the lap ventral hernia repair.  Patient is asking if orders can be placed so she can get it scheduled.  Patient states she needs to get it done before the end of the year due to insurance.  Explained that I would send a message to Dr. Carolynne Edouard to ask and see what we can do.  Patient states understanding and agreeable at this time.

## 2013-08-25 NOTE — Telephone Encounter (Signed)
LMOM> Office notes we received from cardiology did not state she was cleared for surgery. I sent a note to their office requesting a letter that states she is cleared for surgery. Once we receive it we will redo orders and get her scheduled.

## 2013-08-31 ENCOUNTER — Telehealth: Payer: Self-pay | Admitting: Cardiology

## 2013-08-31 NOTE — Telephone Encounter (Signed)
Pt called saying that Dr. Carolynne Edouard wouldn't reschedule surgery until we sent a note of cardia clearance for pt. Office notes and testing were sent to Dr. Carolynne Edouard office and they were received. They are still requesting a letter to clear pt from surgery. Can pt be cleared from a cardiac stand point to have surgery?

## 2013-08-31 NOTE — Telephone Encounter (Signed)
Patient is fine from a cardiac standpoint to undergo surgery.   Dina Rich MD

## 2013-08-31 NOTE — Telephone Encounter (Signed)
This note forwarded to Dr. Carolynne Edouard.

## 2013-09-05 ENCOUNTER — Telehealth (INDEPENDENT_AMBULATORY_CARE_PROVIDER_SITE_OTHER): Payer: Self-pay | Admitting: General Surgery

## 2013-09-05 NOTE — Telephone Encounter (Signed)
Spoke to pt and gave her dates for surgery and they did not work for her she is no longer going to have surgery done.et

## 2014-06-26 ENCOUNTER — Encounter: Payer: Self-pay | Admitting: Internal Medicine

## 2014-06-26 ENCOUNTER — Other Ambulatory Visit: Payer: Self-pay | Admitting: Pain Medicine

## 2014-06-26 DIAGNOSIS — M5136 Other intervertebral disc degeneration, lumbar region: Secondary | ICD-10-CM

## 2014-06-26 DIAGNOSIS — M545 Low back pain, unspecified: Secondary | ICD-10-CM

## 2014-06-28 ENCOUNTER — Other Ambulatory Visit: Payer: 59

## 2014-07-06 ENCOUNTER — Ambulatory Visit
Admission: RE | Admit: 2014-07-06 | Discharge: 2014-07-06 | Disposition: A | Discharge status: Home / Self Care | Payer: 59 | Source: Ambulatory Visit | Attending: Pain Medicine | Admitting: Pain Medicine

## 2014-07-06 DIAGNOSIS — M545 Low back pain, unspecified: Secondary | ICD-10-CM

## 2014-07-06 DIAGNOSIS — M5136 Other intervertebral disc degeneration, lumbar region: Secondary | ICD-10-CM

## 2014-10-02 ENCOUNTER — Other Ambulatory Visit: Payer: Self-pay | Admitting: Obstetrics and Gynecology

## 2014-10-03 ENCOUNTER — Other Ambulatory Visit (HOSPITAL_COMMUNITY): Payer: Self-pay | Admitting: Obstetrics and Gynecology

## 2014-10-03 DIAGNOSIS — E041 Nontoxic single thyroid nodule: Secondary | ICD-10-CM

## 2014-10-03 LAB — CYTOLOGY - PAP

## 2014-10-05 ENCOUNTER — Ambulatory Visit (HOSPITAL_COMMUNITY)
Admission: RE | Admit: 2014-10-05 | Discharge: 2014-10-05 | Disposition: A | Discharge status: Home / Self Care | Payer: 59 | Source: Ambulatory Visit | Attending: Obstetrics and Gynecology | Admitting: Obstetrics and Gynecology

## 2014-10-05 DIAGNOSIS — E041 Nontoxic single thyroid nodule: Secondary | ICD-10-CM | POA: Insufficient documentation

## 2014-10-16 ENCOUNTER — Encounter: Payer: Self-pay | Admitting: Cardiology

## 2014-10-16 ENCOUNTER — Ambulatory Visit (INDEPENDENT_AMBULATORY_CARE_PROVIDER_SITE_OTHER): Payer: 59 | Admitting: Cardiology

## 2014-10-16 VITALS — BP 109/78 | HR 74 | Ht 65.0 in | Wt 246.0 lb

## 2014-10-16 DIAGNOSIS — R002 Palpitations: Secondary | ICD-10-CM

## 2014-10-16 DIAGNOSIS — G4730 Sleep apnea, unspecified: Secondary | ICD-10-CM

## 2014-10-16 NOTE — Patient Instructions (Signed)
Your physician wants you to follow-up in: 1 year with Dr. Bryna Colander will receive a reminder letter in the mail two months in advance. If you don't receive a letter, please call our office to schedule the follow-up appointment.  Your physician recommends that you continue on your current medications as directed. Please refer to the Current Medication list given to you today.  You have been referred to Dr. Luan Pulling for sleep apnea  Thank you for choosing [facility]!!

## 2014-10-16 NOTE — Progress Notes (Signed)
Clinical Summary Ms. Koenen is a 49 y.o.female seen today for follow up of the following medical problems.   1. Palpitations  - symptoms significantly improved after cutting back on caffeine intake - denies any recent palpitations.    2. Hypersomnolence - reports history of daytime somnolence. She also has a history of snoring. Denies ever having a sleep study before.   Past Medical History  Diagnosis Date  . Esophagitis, unspecified   . Unspecified gastritis and gastroduodenitis without mention of hemorrhage   . Obesity, unspecified   . Other chronic nonalcoholic liver disease   . Abdominal pain, epigastric   . Anal fissure   . Follicular cyst of ovary   . Unspecified hemorrhoids without mention of complication   . Irritable bowel syndrome   . Personal history of colonic polyps   . Family history of malignant neoplasm of gastrointestinal tract   . Other chronic nonalcoholic liver disease   . Unspecified hypothyroidism   . Type II or unspecified type diabetes mellitus without mention of complication, not stated as uncontrolled   . Esophageal reflux   . Hyperlipidemia   . Abdominal distention   . Anemia   . Anxiety   . Esophageal stricture   . Allergy     mold,dust, animal dander  . Fatty liver      Allergies  Allergen Reactions  . Cleocin [Clindamycin Hcl] Anaphylaxis and Shortness Of Breath    Blood pressure dropping  . Penicillins Anaphylaxis and Swelling  . Mushroom Extract Complex Nausea And Vomiting and Other (See Comments)    Headache   . Amoxicillin Nausea Only  . Clarithromycin Nausea And Vomiting     Current Outpatient Prescriptions  Medication Sig Dispense Refill  . Dexlansoprazole 30 MG capsule Take 30 mg by mouth daily.    Marland Kitchen levothyroxine (SYNTHROID, LEVOTHROID) 50 MCG tablet Take 50 mcg by mouth daily before breakfast.     No current facility-administered medications for this visit.     Past Surgical History  Procedure Laterality  Date  . Tonsillectomy  1994  . Cesarean section  1999  . Ovarian cyst removal  2002  . Cholecystectomy  2011  . Colonoscopy    . Abdominal hysterectomy       Allergies  Allergen Reactions  . Cleocin [Clindamycin Hcl] Anaphylaxis and Shortness Of Breath    Blood pressure dropping  . Penicillins Anaphylaxis and Swelling  . Mushroom Extract Complex Nausea And Vomiting and Other (See Comments)    Headache   . Amoxicillin Nausea Only  . Clarithromycin Nausea And Vomiting      Family History  Problem Relation Age of Onset  . Colon cancer Maternal Aunt     cousin  . Cancer Maternal Aunt     colon  . Breast cancer Mother   . Cancer Mother     breast  . Other Mother     car accident  . Heart disease Paternal Grandmother   . Other Father     liver failure  . Cancer Cousin     colon  . Colon cancer Cousin   . Esophageal cancer Maternal Uncle   . Colon cancer Maternal Uncle   . Rectal cancer Neg Hx   . Stomach cancer Neg Hx      Social History Ms. Krizek reports that she has quit smoking. She has never used smokeless tobacco. Ms. Cormier reports that she does not drink alcohol.   Review of Systems CONSTITUTIONAL: No weight loss,  fever, chills, weakness or fatigue.  HEENT: Eyes: No visual loss, blurred vision, double vision or yellow sclerae.No hearing loss, sneezing, congestion, runny nose or sore throat.  SKIN: No rash or itching.  CARDIOVASCULAR: per HPI RESPIRATORY: No shortness of breath, cough or sputum.  GASTROINTESTINAL: No anorexia, nausea, vomiting or diarrhea. No abdominal pain or blood.  GENITOURINARY: No burning on urination, no polyuria NEUROLOGICAL: No headache, dizziness, syncope, paralysis, ataxia, numbness or tingling in the extremities. No change in bowel or bladder control.  MUSCULOSKELETAL: No muscle, back pain, joint pain or stiffness.  LYMPHATICS: No enlarged nodes. No history of splenectomy.  PSYCHIATRIC: No history of depression or anxiety.    ENDOCRINOLOGIC: No reports of sweating, cold or heat intolerance. No polyuria or polydipsia.  Marland Kitchen   Physical Examination p 74 bp 109/78 Wt 246 lbs BMI 41 Gen: resting comfortably, no acute distress HEENT: no scleral icterus, pupils equal round and reactive, no palptable cervical adenopathy,  CV: RRR, no m/r/g, nO JVD Resp: Clear to auscultation bilaterally GI: abdomen is soft, non-tender, non-distended, normal bowel sounds, no hepatosplenomegaly MSK: extremities are warm, bilateral trace edema Skin: warm, no rash Neuro:  no focal deficits Psych: appropriate affect   Diagnostic Studies Echo 08/16/13: LVEF 60-65%, mild LVH, grade II diastolic dysfunction,   32/02/33 Stress echo: Negative for ischemia.     Assessment and Plan   1. Palpitations - resolved after decreased caffeine intake, continue to follow clinically.   2. Hypersomnolence - concern for possible OSA, will refer to Dr Luan Pulling for sleep study evaluation.    F/u 1 year   Arnoldo Lenis, M.D.

## 2015-03-22 DIAGNOSIS — Z8719 Personal history of other diseases of the digestive system: Secondary | ICD-10-CM | POA: Insufficient documentation

## 2015-09-03 ENCOUNTER — Ambulatory Visit (INDEPENDENT_AMBULATORY_CARE_PROVIDER_SITE_OTHER): Payer: 59 | Admitting: Adult Health

## 2015-09-03 ENCOUNTER — Encounter: Payer: Self-pay | Admitting: Adult Health

## 2015-09-03 VITALS — BP 110/76 | HR 100 | Ht 65.0 in | Wt 245.0 lb

## 2015-09-03 DIAGNOSIS — E038 Other specified hypothyroidism: Secondary | ICD-10-CM

## 2015-09-03 DIAGNOSIS — M791 Myalgia, unspecified site: Secondary | ICD-10-CM

## 2015-09-03 DIAGNOSIS — R002 Palpitations: Secondary | ICD-10-CM

## 2015-09-03 DIAGNOSIS — E0800 Diabetes mellitus due to underlying condition with hyperosmolarity without nonketotic hyperglycemic-hyperosmolar coma (NKHHC): Secondary | ICD-10-CM

## 2015-09-03 DIAGNOSIS — K760 Fatty (change of) liver, not elsewhere classified: Secondary | ICD-10-CM | POA: Diagnosis not present

## 2015-09-03 DIAGNOSIS — Z136 Encounter for screening for cardiovascular disorders: Secondary | ICD-10-CM | POA: Diagnosis not present

## 2015-09-03 NOTE — Progress Notes (Signed)
Name: Lori Barber    DOB: Aug 09, 1966  Age: 49 y.o.  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   VS Filed Vitals:   09/03/15 1515  BP: 110/76  Pulse: 100  Height: 5\' 5"  (1.651 m)  Weight: 245 lb (111.131 kg)  SpO2: 97%    Weights Current Weight  09/03/15 245 lb (111.131 kg)  10/16/14 246 lb (111.585 kg)  08/22/13 248 lb 1.9 oz (112.546 kg)    Blood Pressure  BP Readings from Last 3 Encounters:  09/03/15 110/76  10/16/14 109/78  08/22/13 121/83     Admit date:  (Not on file) Last encounter with RMR:  Visit date not found   Allergy Cleocin; Penicillins; Mushroom extract complex; Amoxicillin; and Clarithromycin  No current outpatient prescriptions on file.   No current facility-administered medications for this visit.    Discontinued Meds:    Medications Discontinued During This Encounter  Medication Reason  . HYDROcodone-acetaminophen (NORCO) 7.5-325 MG per tablet Error    Patient Active Problem List   Diagnosis Date Noted  . Chest pain 07/24/2013  . Diabetes mellitus type 2, diet-controlled ([place]) 07/24/2013  . Palpitations 07/24/2013  . Duodenitis 09/15/2011  . Gastritis 09/15/2011  . Nausea alone 09/15/2011  . GERD with stricture 09/15/2011  . Nausea 09/04/2011  . Diarrhea following gastrointestinal surgery 09/04/2011  . Hernia of unspecified site of abdominal cavity without mention of obstruction or gangrene 09/04/2011  . Obesity 09/04/2011  . S/P cholecystectomy 09/04/2011  . History of gastroesophageal reflux (GERD) 09/04/2011  . Ventral hernia 07/11/2011  . IBS (irritable bowel syndrome) 07/11/2011  . GERD (gastroesophageal reflux disease) 07/11/2011  . Fatty infiltration of liver 07/11/2011  . Bilateral ovarian cysts 07/11/2011  . Hx of cholecystectomy 07/11/2011  . ABDOMINAL WALL HERNIA  10/12/2009  . ARTHRALGIA 03/06/2009  . ESOPHAGITIS 01/22/2009  . GASTRITIS 12/08/2008  . OBESITY, UNSPECIFIED 12/07/2008  . ABDOMINAL PAIN-EPIGASTRIC 12/07/2008  . HYPOTHYROIDISM 11/09/2008  . HEMORRHOIDS 11/09/2008  . GERD 11/09/2008  . IBS 11/09/2008  . FATTY LIVER DISEASE 11/09/2008  . OVARIAN CYST 11/09/2008  . COLONIC POLYPS, ADENOMATOUS, HX OF 11/09/2008    LABS    Component Value Date/Time   NA 138 07/25/2013 0512   NA 138 07/24/2013 1058   NA 135 10/02/2012 0145   K 3.8 07/25/2013 0512   K 3.9 07/24/2013 1058   K 3.8 10/02/2012 0145   CL 104 07/25/2013 0512   CL 104 07/24/2013 1058   CL 102 10/02/2012 0145   CO2 27 07/25/2013 0512   CO2 26 07/24/2013 1058   CO2 23 10/02/2012 0145   GLUCOSE 110* 07/25/2013 0512   GLUCOSE 102* 07/24/2013 1058   GLUCOSE 142* 10/02/2012 0145   BUN 11 07/25/2013 0512   BUN 10 07/24/2013 1058   BUN 14 10/02/2012 0145   CREATININE 0.88 07/25/2013 0512   CREATININE 0.77 07/24/2013 1058   CREATININE 0.76 10/02/2012 0145   CALCIUM 9.2 07/25/2013 0512   CALCIUM 9.6 07/24/2013 1058   CALCIUM 9.1 10/02/2012 0145   GFRNONAA 76* 07/25/2013 0512   GFRNONAA >90 07/24/2013 1058   GFRNONAA >90 10/02/2012 0145   GFRAA 88* 07/25/2013 0512   GFRAA >90 07/24/2013 1058   GFRAA >90 10/02/2012 0145   CMP     Component  Value Date/Time   NA 138 07/25/2013 0512   K 3.8 07/25/2013 0512   CL 104 07/25/2013 0512   CO2 27 07/25/2013 0512   GLUCOSE 110* 07/25/2013 0512   BUN 11 07/25/2013 0512   CREATININE 0.88 07/25/2013 0512   CALCIUM 9.2 07/25/2013 0512   PROT 7.7 02/22/2013 0919   ALBUMIN 3.6 02/22/2013 0919   AST 44* 02/22/2013 0919   ALT 48* 02/22/2013 0919   ALKPHOS 68 02/22/2013 0919   BILITOT 0.6 02/22/2013 0919   GFRNONAA 76* 07/25/2013 0512   GFRAA 88* 07/25/2013 0512       Component Value Date/Time   WBC 8.4 07/25/2013 0512   WBC 8.5 07/24/2013 1058   WBC 9.0 03/04/2013 0820   HGB 12.7 07/25/2013 0512   HGB 13.6  07/24/2013 1058   HGB 13.1 03/04/2013 0820   HCT 38.4 07/25/2013 0512   HCT 40.8 07/24/2013 1058   HCT 38.8 03/04/2013 0820   MCV 91.6 07/25/2013 0512   MCV 91.3 07/24/2013 1058   MCV 89.4 03/04/2013 0820    Lipid Panel     Component Value Date/Time   CHOL 204* 01/23/2009 0859   TRIG 108.0 01/23/2009 0859   HDL 33.20* 01/23/2009 0859   CHOLHDL 6 01/23/2009 0859   VLDL 21.6 01/23/2009 0859   LDLDIRECT 145.0 01/23/2009 0859    ABG No results found for: PHART, PCO2ART, PO2ART, HCO3, TCO2, ACIDBASEDEF, O2SAT   Lab Results  Component Value Date   TSH 1.798 07/24/2013   BNP (last 3 results) No results for input(s): BNP in the last 8760 hours.  ProBNP (last 3 results) No results for input(s): PROBNP in the last 8760 hours.  Cardiac Panel (last 3 results) No results for input(s): CKTOTAL, CKMB, TROPONINI, RELINDX in the last 72 hours.  Iron/TIBC/Ferritin/ %Sat    Component Value Date/Time   IRON 53 02/04/2013 1030   FERRITIN 153.6 02/04/2013 1030   IRONPCTSAT 15.7* 02/04/2013 1030     EKG Orders placed or performed in visit on 09/03/15  . EKG 12-Lead     Prior Assessment and Plan Problem List as of 09/03/2015      Cardiovascular and Mediastinum   HEMORRHOIDS     Digestive   ESOPHAGITIS   GERD   GASTRITIS   IBS   FATTY LIVER DISEASE   IBS (irritable bowel syndrome)   GERD (gastroesophageal reflux disease)   Fatty infiltration of liver   Diarrhea following gastrointestinal surgery   Duodenitis   Gastritis   GERD with stricture     Endocrine   HYPOTHYROIDISM   Diabetes mellitus type 2, diet-controlled ([facility])     Genitourinary   OVARIAN CYST   Bilateral ovarian cysts     Other   OBESITY, UNSPECIFIED   ABDOMINAL WALL HERNIA   ARTHRALGIA   ABDOMINAL PAIN-EPIGASTRIC   COLONIC POLYPS, ADENOMATOUS, HX OF   Ventral hernia   Hx of cholecystectomy   Nausea   Hernia of unspecified site of abdominal cavity without mention of obstruction or gangrene    Obesity   S/P cholecystectomy   History of gastroesophageal reflux (GERD)   Nausea alone   Chest pain   Palpitations       Imaging: No results found.

## 2015-09-03 NOTE — Patient Instructions (Signed)
Your physician wants you to follow-up in: 1 Year.  You will receive a reminder letter in the mail two months in advance. If you don't receive a letter, please call our office to schedule the follow-up appointment.  Your physician recommends that you continue on your current medications as directed. Please refer to the Current Medication list given to you today.  If you need a refill on your cardiac medications before your next appointment, please call your pharmacy.  Thank you for choosing [facility]!   

## 2015-09-03 NOTE — Progress Notes (Signed)
Cardiology Office Note   Date:  09/03/2015   ID:  Lori Barber, DOB [birth year], MRN [id number]  PCP:  No PCP Per Patient  Cardiologist: Cloria Spring, NP   Chief Complaint  Patient presents with  . Palpitations      History of Present Illness: Lori Barber is a 49 y.o. female who presents for ongoing assessment and management of palpitations, they were resolved after decreased caffeine intake. She was referred for OSA sleep study. Stress echo in 08/16/2013 Stress ECG conclusions: The stress ECG was negative for ischemia. Staged echo: There was no echocardiographic evidence for stress-induced ischemia.   She comes today with scapular pain that has been bothering her for a week. 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 She only feels it on the right. It comes and goes with exertion and is often sore at night. She does not have a PCP, nor has she had labs completed. She is otherwise without complaints of palpitations or chest pain. She is not on any medications.    Past Medical History  Diagnosis Date  . Esophagitis, unspecified   . Unspecified gastritis and gastroduodenitis without mention of hemorrhage   . Obesity, unspecified   . Other chronic nonalcoholic liver disease   . Abdominal pain, epigastric   . Anal fissure   . Follicular cyst of ovary   . Unspecified hemorrhoids without mention of complication   . Irritable bowel syndrome   . Personal history of colonic polyps   . Family history of malignant neoplasm of gastrointestinal tract   . Other chronic nonalcoholic liver disease   . Unspecified hypothyroidism   . Type II or unspecified type diabetes mellitus without mention of complication, not stated as uncontrolled   . Esophageal reflux   . Hyperlipidemia   . Abdominal distention   . Anemia   . Anxiety   . Esophageal stricture   . Allergy     mold,dust, animal dander  . Fatty liver     Past Surgical History  Procedure Laterality  Date  . Tonsillectomy  1994  . Cesarean section  1999  . Ovarian cyst removal  2002  . Cholecystectomy  2011  . Colonoscopy    . Abdominal hysterectomy       No current outpatient prescriptions on file.   No current facility-administered medications for this visit.    Allergies:   Cleocin; Penicillins; Mushroom extract complex; Amoxicillin; and Clarithromycin    Social History:  The patient  reports that she has been smoking Cigarettes.  She started smoking about 36 years ago. She has a 13 pack-year smoking history. She has never used smokeless tobacco. She reports that she does not drink alcohol or use illicit drugs.   Family History:  The patient's family history includes Breast cancer in her mother; Cancer in her cousin, maternal aunt, and mother; Colon cancer in her cousin, maternal aunt, and maternal uncle; Esophageal cancer in her maternal uncle; Heart disease in her paternal grandmother; Other in her father and mother. There is no history of Rectal cancer or Stomach cancer.    ROS: All other systems are reviewed and negative. Unless otherwise mentioned in H&P    PHYSICAL EXAM: VS:  BP 110/76 mmHg  Pulse 100  Ht 5\' 5"  (1.651 m)  Wt 245 lb (111.131 kg)  BMI 40.77 kg/m2  SpO2 97% , BMI Body mass index is 40.77 kg/(m^2). GEN: Well nourished, well developed, in no acute distressMorbidly obese HEENT: normal  Neck: no JVD, carotid bruits, or masses Cardiac: RRR; no murmurs, rubs, or gallops,no edema  Respiratory:  clear to auscultation bilaterally, normal work of breathing GI: soft, nontender, nondistended, + BS MS: no deformity or atrophy Skin: warm and dry, no rash Neuro:  Strength and sensation are intact Psych: euthymic mood, full affect   EKG:  NSR rate of 92 bpm.   Recent Labs: No results found for requested labs within last 365 days.    Lipid Panel    Component Value Date/Time   CHOL 204* 01/23/2009 0859   TRIG 108.0 01/23/2009 0859   HDL 33.20*  01/23/2009 0859   CHOLHDL 6 01/23/2009 0859   VLDL 21.6 01/23/2009 0859   LDLDIRECT 145.0 01/23/2009 0859      Wt Readings from Last 3 Encounters:  09/03/15 245 lb (111.131 kg)  10/16/14 246 lb (111.585 kg)  08/22/13 248 lb 1.9 oz (112.546 kg)       ASSESSMENT AND PLAN:  1.  Right Scapular pain: Doubt cardiac etiology. Would recommend OTC NSAIDs for now, as I think it is musculoskeletal. She has not had labs in over a year. Will order CBC, BMET, Lipids and LFTS. I will refer her to Dr. Anastasio Champion to be established with a PCP.   2. Hx of Diabetes: She has no idea how her diabetes is doing at the moment. I will check Hgb A1C  3. History of Hypothyroidism: I will check TSH.   4. Morbid Obesity: Weight loss and increased activity is recommended.   Current medicines are reviewed at length with the patient today.    Labs/ tests ordered today include: BMET, CBC, Hgb A1c, Fasting lipids and LFTs and TSH. All labs are going to be sent to Dr. Anastasio Champion.  Orders Placed This Encounter  Procedures  . EKG 12-Lead     Disposition:   FU with one year.    Signed, Lori Sims, NP  09/03/2015 3:35 PM    [facility] [address], [place], [place] [address] Phone: [phone]; Fax: [phone]

## 2015-09-07 LAB — CBC WITH DIFFERENTIAL/PLATELET
Basophils Absolute: 0 10*3/uL (ref 0.0–0.1)
Basophils Relative: 0 % (ref 0–1)
EOS PCT: 6 % — AB (ref 0–5)
Eosinophils Absolute: 0.5 10*3/uL (ref 0.0–0.7)
HEMATOCRIT: 39 % (ref 36.0–46.0)
Hemoglobin: 13.4 g/dL (ref 12.0–15.0)
LYMPHS ABS: 2.6 10*3/uL (ref 0.7–4.0)
LYMPHS PCT: 29 % (ref 12–46)
MCH: 30.2 pg (ref 26.0–34.0)
MCHC: 34.4 g/dL (ref 30.0–36.0)
MCV: 88 fL (ref 78.0–100.0)
MONOS PCT: 9 % (ref 3–12)
MPV: 9.9 fL (ref 8.6–12.4)
Monocytes Absolute: 0.8 10*3/uL (ref 0.1–1.0)
Neutro Abs: 5 10*3/uL (ref 1.7–7.7)
Neutrophils Relative %: 56 % (ref 43–77)
PLATELETS: 321 10*3/uL (ref 150–400)
RBC: 4.43 MIL/uL (ref 3.87–5.11)
RDW: 13.9 % (ref 11.5–15.5)
WBC: 8.9 10*3/uL (ref 4.0–10.5)

## 2015-09-07 LAB — BASIC METABOLIC PANEL
BUN: 12 mg/dL (ref 7–25)
CALCIUM: 8.9 mg/dL (ref 8.6–10.4)
CO2: 28 mmol/L (ref 20–31)
CREATININE: 0.73 mg/dL (ref 0.50–1.05)
Chloride: 105 mmol/L (ref 98–110)
Glucose, Bld: 106 mg/dL — ABNORMAL HIGH (ref 65–99)
Potassium: 4.2 mmol/L (ref 3.5–5.3)
Sodium: 139 mmol/L (ref 135–146)

## 2015-09-07 LAB — HEMOGLOBIN A1C
Hgb A1c MFr Bld: 5.6 % (ref <5.7)
MEAN PLASMA GLUCOSE: 114 mg/dL (ref <117)

## 2015-09-07 LAB — HEPATIC FUNCTION PANEL
ALBUMIN: 3.8 g/dL (ref 3.6–5.1)
ALK PHOS: 73 U/L (ref 33–130)
ALT: 31 U/L — ABNORMAL HIGH (ref 6–29)
AST: 29 U/L (ref 10–35)
BILIRUBIN TOTAL: 0.4 mg/dL (ref 0.2–1.2)
Bilirubin, Direct: 0.1 mg/dL (ref <=0.2)
Indirect Bilirubin: 0.3 mg/dL (ref 0.2–1.2)
Total Protein: 7.1 g/dL (ref 6.1–8.1)

## 2015-09-07 LAB — LIPID PANEL
Cholesterol: 183 mg/dL (ref 125–200)
HDL: 35 mg/dL — AB (ref >=46)
LDL CALC: 129 mg/dL (ref <130)
TRIGLYCERIDES: 96 mg/dL (ref <150)
Total CHOL/HDL Ratio: 5.2 Ratio — ABNORMAL HIGH (ref <=5.0)
VLDL: 19 mg/dL (ref <30)

## 2015-09-07 LAB — TSH: TSH: 2.533 u[IU]/mL (ref 0.350–4.500)

## 2015-09-25 ENCOUNTER — Encounter: Payer: Self-pay | Admitting: Gastroenterology

## 2016-04-23 ENCOUNTER — Other Ambulatory Visit: Payer: Self-pay | Admitting: Family Medicine

## 2016-04-23 DIAGNOSIS — M545 Low back pain: Secondary | ICD-10-CM

## 2016-06-06 ENCOUNTER — Ambulatory Visit
Admission: RE | Admit: 2016-06-06 | Discharge: 2016-06-06 | Disposition: A | Discharge status: Home / Self Care | Payer: 59 | Source: Ambulatory Visit | Attending: Family Medicine | Admitting: Family Medicine

## 2016-06-06 DIAGNOSIS — M545 Low back pain: Secondary | ICD-10-CM

## 2016-07-08 ENCOUNTER — Ambulatory Visit (INDEPENDENT_AMBULATORY_CARE_PROVIDER_SITE_OTHER): Payer: 59 | Admitting: Internal Medicine

## 2016-07-08 ENCOUNTER — Encounter: Payer: Self-pay | Admitting: Internal Medicine

## 2016-07-08 ENCOUNTER — Encounter (INDEPENDENT_AMBULATORY_CARE_PROVIDER_SITE_OTHER): Payer: Self-pay

## 2016-07-08 VITALS — BP 100/76 | HR 116 | Ht 64.75 in | Wt 248.1 lb

## 2016-07-08 DIAGNOSIS — R197 Diarrhea, unspecified: Secondary | ICD-10-CM

## 2016-07-08 DIAGNOSIS — R52 Pain, unspecified: Secondary | ICD-10-CM

## 2016-07-08 DIAGNOSIS — K760 Fatty (change of) liver, not elsewhere classified: Secondary | ICD-10-CM

## 2016-07-08 DIAGNOSIS — R21 Rash and other nonspecific skin eruption: Secondary | ICD-10-CM

## 2016-07-08 NOTE — Patient Instructions (Addendum)
  Go back on gluten and in 2 weeks come to the lab and have celiac test drawn.  Our lab is open 7:30 AM-5:30PM, close for lunch 1:30pm-2:00pm. No appointment needed.    I appreciate the opportunity to care for you. Silvano Rusk, MD, [facility]

## 2016-07-08 NOTE — Progress Notes (Signed)
Lori Barber 50 y.o. 06/26/1966 [id number] Assessment & Plan:   Encounter Diagnoses  Name Primary?  . Diarrhea, unspecified type Yes  . Fatty liver   . Body aches   . Rash and nonspecific skin eruption    The patient has a long history of signs and symptoms suggestive of celiac disease but could be IBS as well. I told her the only way to really no would be to test her while she is on gluten. A gluten rechallenge for 2 weeks and antibody levels will be done. Her other plans pending that. It would be a unifying diagnosis. She did have negative serologies in past ? If was gluten free then.  I appreciate the opportunity to care for this patient. CC: Lori Barber Althisar PA-C Jory Sims, NP   Subjective:   Chief Complaint: Abdominal bloating with tender abdomen constipation and diarrhea  HPI The patient is a nice 50 year old married white woman who was previously followed by Dr. Sharlett Iles with a diagnosis of IBS. Extensive workup in the past is reviewed. She has known fatty liver, with liver biopsy showing steatohepatitis and peri-sinusoidal fibrosis in 2014. She has a history of adenomatous colon polyps. Issue transglutaminase antibodies have been negative in the past. She believes she has celiac disease or at least gluten sensitivity perhaps because she was having a lot of postprandial diarrhea, ulcers on her abdominal wall, stopped gluten and everything significantly improved. She also has a history of intermittent viral achiness like symptoms, abdominal cramping and bloating. All was improved if she avoids gluten. She wonders if she has celiac disease. No previous duodenal biopsies though she had mild gastritis on a prior EGD. Allergies  Allergen Reactions  . Cleocin [Clindamycin Hcl] Anaphylaxis and Shortness Of Breath    Blood pressure dropping  . Penicillins Anaphylaxis and Swelling  . Mushroom Extract Complex Nausea And Vomiting and Other (See Comments)    Headache   .  Amoxicillin Nausea Only  . Clarithromycin Nausea And Vomiting   No outpatient prescriptions prior to visit.   No facility-administered medications prior to visit.    Past Medical History:  Diagnosis Date  . Abdominal distention   . Abdominal pain, epigastric   . Allergy    mold,dust, animal dander  . Anal fissure   . Anemia   . Anxiety   . Esophageal reflux   . Esophageal stricture   . Esophagitis, unspecified   . Family history of malignant neoplasm of gastrointestinal tract   . Fatty liver   . Follicular cyst of ovary   . Hyperlipidemia   . Irritable bowel syndrome   . Obesity, unspecified   . Other chronic nonalcoholic liver disease   . Other chronic nonalcoholic liver disease   . Personal history of colonic polyps   . Type II or unspecified type diabetes mellitus without mention of complication, not stated as uncontrolled   . Unspecified gastritis and gastroduodenitis without mention of hemorrhage   . Unspecified hemorrhoids without mention of complication   . Unspecified hypothyroidism    Past Surgical History:  Procedure Laterality Date  . ABDOMINAL HYSTERECTOMY    . CESAREAN SECTION  1999  . CHOLECYSTECTOMY  2011  . COLONOSCOPY    . OVARIAN CYST REMOVAL  2002  . TONSILLECTOMY  1994  . UPPER GASTROINTESTINAL ENDOSCOPY     Social History   Social History  . Marital status: Married    Spouse name: N/A  . Number of children: 1  . Years of education:  N/A   Occupational History  . Administrative assistant [employer]   Social History Main Topics  . Smoking status: Current Every Day Smoker    Packs/day: 0.50    Years: 26.00    Types: Cigarettes    Start date: 11/07/1978  . Smokeless tobacco: Never Used  . Alcohol use No     Comment: occasionally  . Drug use: No  . Sexual activity: Yes    Birth control/ protection: None   Other Topics Concern  . None   Social History Narrative   Patient is married with one daughter. She is Web designer at [employer] . 2 Caffeinated beverages daily, no alcohol   07/10/2016      Family History  Problem Relation Age of Onset  . Colon cancer Maternal Aunt     cousin  . Cancer Maternal Aunt     colon  . Breast cancer Mother   . Cancer Mother     breast  . Other Mother     car accident  . Other Father     liver failure  . Cancer Cousin     colon  . Colon cancer Cousin   . Esophageal cancer Maternal Uncle   . Colon cancer Maternal Uncle   . Heart disease Paternal Grandmother   . Rectal cancer Neg Hx   . Stomach cancer Neg Hx    Review of Systems Positive for insomnia and night sweats. All other review of systems are negative  Objective:   Physical Exam @BP  100/76 (BP Location: Left Arm, Patient Position: Sitting, Cuff Size: Normal)   Pulse (!) 116   Ht 5' 4.75" (1.645 m)   Wt 248 lb 2 oz (112.5 kg)   BMI 41.61 kg/m @  General:  Well-developed, well-nourished and in no acute distress, she is obese Eyes:  anicteric. ENT:   Mouth and posterior pharynx free of lesions.  Neck:   supple w/o thyromegaly or mass.  Lungs: Clear to auscultation bilaterally. Heart:  S1S2, no rubs, murmurs, gallops. Abdomen:  soft, non-tender, no hepatosplenomegaly, hernia, or mass and BS+.  Lymph:  no cervical or supraclavicular adenopathy. Extremities:   no edema, cyanosis or clubbing Skin   no rash. Neuro:  A&O x 3.  Psych:  appropriate mood and  Affect.   Data Reviewed:  As per history of present illness prior GI notes endoscopies labs in the EMR going back to at least 2014. Lipid panel from primary care March 2017 cholesterol 228 LDL 153 normal triglycerides HDL is 66 VLDL 9. Her chemistries were normal. All except and ALT 31. Vitamin D 24.43 that is mildly low.

## 2016-07-10 ENCOUNTER — Encounter: Payer: Self-pay | Admitting: Internal Medicine

## 2016-07-24 ENCOUNTER — Encounter: Payer: Self-pay | Admitting: Internal Medicine

## 2016-07-25 ENCOUNTER — Other Ambulatory Visit (INDEPENDENT_AMBULATORY_CARE_PROVIDER_SITE_OTHER): Payer: 59

## 2016-07-25 ENCOUNTER — Encounter: Payer: Self-pay | Admitting: Internal Medicine

## 2016-07-25 DIAGNOSIS — R197 Diarrhea, unspecified: Secondary | ICD-10-CM

## 2016-07-25 LAB — IGA: IgA: 331 mg/dL (ref 68–378)

## 2016-07-28 LAB — TISSUE TRANSGLUTAMINASE, IGA: TISSUE TRANSGLUTAMINASE AB, IGA: 1 U/mL (ref <4)

## 2016-07-28 NOTE — Progress Notes (Signed)
Celiac test is negative Ask her to do the sucrase deficiency breath test to evaluate diarrhea

## 2016-08-01 ENCOUNTER — Telehealth: Payer: Self-pay | Admitting: Internal Medicine

## 2016-08-04 NOTE — Telephone Encounter (Signed)
See results notes for additional details. 

## 2016-08-08 ENCOUNTER — Encounter: Payer: Self-pay | Admitting: Internal Medicine

## 2017-01-15 DIAGNOSIS — M542 Cervicalgia: Secondary | ICD-10-CM | POA: Diagnosis not present

## 2017-02-12 DIAGNOSIS — Z720 Tobacco use: Secondary | ICD-10-CM | POA: Insufficient documentation

## 2017-02-12 DIAGNOSIS — Z8249 Family history of ischemic heart disease and other diseases of the circulatory system: Secondary | ICD-10-CM | POA: Insufficient documentation

## 2017-02-12 DIAGNOSIS — D369 Benign neoplasm, unspecified site: Secondary | ICD-10-CM | POA: Insufficient documentation

## 2017-02-12 DIAGNOSIS — Z0000 Encounter for general adult medical examination without abnormal findings: Secondary | ICD-10-CM | POA: Diagnosis not present

## 2017-02-12 DIAGNOSIS — K760 Fatty (change of) liver, not elsewhere classified: Secondary | ICD-10-CM | POA: Insufficient documentation

## 2017-03-31 ENCOUNTER — Emergency Department (HOSPITAL_COMMUNITY): Payer: 59

## 2017-03-31 ENCOUNTER — Encounter (HOSPITAL_COMMUNITY): Payer: Self-pay | Admitting: Vascular Surgery

## 2017-03-31 ENCOUNTER — Observation Stay (HOSPITAL_COMMUNITY)
Admission: EM | Admit: 2017-03-31 | Discharge: 2017-04-03 | DRG: 392 | Disposition: A | Discharge status: Home / Self Care | Payer: 59 | Attending: Internal Medicine | Admitting: Internal Medicine

## 2017-03-31 DIAGNOSIS — Z91018 Allergy to other foods: Secondary | ICD-10-CM | POA: Insufficient documentation

## 2017-03-31 DIAGNOSIS — F17210 Nicotine dependence, cigarettes, uncomplicated: Secondary | ICD-10-CM | POA: Insufficient documentation

## 2017-03-31 DIAGNOSIS — K529 Noninfective gastroenteritis and colitis, unspecified: Secondary | ICD-10-CM

## 2017-03-31 DIAGNOSIS — Z86010 Personal history of colonic polyps: Secondary | ICD-10-CM | POA: Insufficient documentation

## 2017-03-31 DIAGNOSIS — Z79899 Other long term (current) drug therapy: Secondary | ICD-10-CM | POA: Insufficient documentation

## 2017-03-31 DIAGNOSIS — E669 Obesity, unspecified: Secondary | ICD-10-CM | POA: Insufficient documentation

## 2017-03-31 DIAGNOSIS — Z87892 Personal history of anaphylaxis: Secondary | ICD-10-CM | POA: Diagnosis not present

## 2017-03-31 DIAGNOSIS — R1031 Right lower quadrant pain: Principal | ICD-10-CM | POA: Diagnosis present

## 2017-03-31 DIAGNOSIS — K589 Irritable bowel syndrome without diarrhea: Secondary | ICD-10-CM | POA: Insufficient documentation

## 2017-03-31 DIAGNOSIS — Z888 Allergy status to other drugs, medicaments and biological substances status: Secondary | ICD-10-CM | POA: Insufficient documentation

## 2017-03-31 DIAGNOSIS — Z6839 Body mass index (BMI) 39.0-39.9, adult: Secondary | ICD-10-CM | POA: Diagnosis not present

## 2017-03-31 DIAGNOSIS — K37 Unspecified appendicitis: Secondary | ICD-10-CM

## 2017-03-31 DIAGNOSIS — K353 Acute appendicitis with localized peritonitis, without perforation or gangrene: Secondary | ICD-10-CM

## 2017-03-31 DIAGNOSIS — Z880 Allergy status to penicillin: Secondary | ICD-10-CM | POA: Insufficient documentation

## 2017-03-31 DIAGNOSIS — K6389 Other specified diseases of intestine: Secondary | ICD-10-CM | POA: Insufficient documentation

## 2017-03-31 DIAGNOSIS — R935 Abnormal findings on diagnostic imaging of other abdominal regions, including retroperitoneum: Secondary | ICD-10-CM

## 2017-03-31 HISTORY — DX: Other specified postprocedural states: R11.2

## 2017-03-31 HISTORY — DX: Other specified postprocedural states: Z98.890

## 2017-03-31 LAB — URINALYSIS, ROUTINE W REFLEX MICROSCOPIC
Bilirubin Urine: NEGATIVE
Glucose, UA: NEGATIVE mg/dL
KETONES UR: NEGATIVE mg/dL
Nitrite: NEGATIVE
PH: 5 (ref 5.0–8.0)
Protein, ur: NEGATIVE mg/dL
Specific Gravity, Urine: 1.02 (ref 1.005–1.030)

## 2017-03-31 LAB — COMPREHENSIVE METABOLIC PANEL
ALBUMIN: 3.7 g/dL (ref 3.5–5.0)
ALT: 37 U/L (ref 14–54)
AST: 37 U/L (ref 15–41)
Alkaline Phosphatase: 80 U/L (ref 38–126)
Anion gap: 5 (ref 5–15)
BUN: 9 mg/dL (ref 6–20)
CHLORIDE: 104 mmol/L (ref 101–111)
CO2: 27 mmol/L (ref 22–32)
CREATININE: 0.85 mg/dL (ref 0.44–1.00)
Calcium: 9.4 mg/dL (ref 8.9–10.3)
GFR calc Af Amer: >60 mL/min (ref >60)
GFR calc non Af Amer: >60 mL/min (ref >60)
Glucose, Bld: 108 mg/dL — ABNORMAL HIGH (ref 65–99)
Potassium: 4.5 mmol/L (ref 3.5–5.1)
SODIUM: 136 mmol/L (ref 135–145)
Total Bilirubin: 0.8 mg/dL (ref 0.3–1.2)
Total Protein: 7.7 g/dL (ref 6.5–8.1)

## 2017-03-31 LAB — CBC
HEMATOCRIT: 41.4 % (ref 36.0–46.0)
Hemoglobin: 13.7 g/dL (ref 12.0–15.0)
MCH: 30.3 pg (ref 26.0–34.0)
MCHC: 33.1 g/dL (ref 30.0–36.0)
MCV: 91.6 fL (ref 78.0–100.0)
PLATELETS: 300 10*3/uL (ref 150–400)
RBC: 4.52 MIL/uL (ref 3.87–5.11)
RDW: 13.4 % (ref 11.5–15.5)
WBC: 13.9 10*3/uL — ABNORMAL HIGH (ref 4.0–10.5)

## 2017-03-31 LAB — LIPASE, BLOOD: LIPASE: 33 U/L (ref 11–51)

## 2017-03-31 MED ORDER — ONDANSETRON HCL 4 MG/2ML IJ SOLN
4.0000 mg | Freq: Once | INTRAMUSCULAR | Status: AC
  Administered 2017-03-31: 4 mg via INTRAVENOUS
  Filled 2017-03-31: qty 2

## 2017-03-31 MED ORDER — CIPROFLOXACIN IN D5W 400 MG/200ML IV SOLN
400.0000 mg | Freq: Two times a day (BID) | INTRAVENOUS | Status: DC
  Administered 2017-03-31 – 2017-04-03 (×6): 400 mg via INTRAVENOUS
  Filled 2017-03-31 (×7): qty 200

## 2017-03-31 MED ORDER — HYDROMORPHONE HCL 1 MG/ML IJ SOLN
1.0000 mg | Freq: Once | INTRAMUSCULAR | Status: AC
  Administered 2017-03-31: 1 mg via INTRAVENOUS
  Filled 2017-03-31: qty 1

## 2017-03-31 MED ORDER — CIPROFLOXACIN IN D5W 400 MG/200ML IV SOLN
400.0000 mg | Freq: Once | INTRAVENOUS | Status: AC
  Administered 2017-03-31: 400 mg via INTRAVENOUS
  Filled 2017-03-31: qty 200

## 2017-03-31 MED ORDER — MORPHINE SULFATE (PF) 4 MG/ML IV SOLN
4.0000 mg | Freq: Once | INTRAVENOUS | Status: AC
  Administered 2017-03-31: 4 mg via INTRAVENOUS
  Filled 2017-03-31: qty 1

## 2017-03-31 MED ORDER — MORPHINE SULFATE (PF) 4 MG/ML IV SOLN
2.0000 mg | INTRAVENOUS | Status: DC | PRN
  Administered 2017-04-01: 2 mg via INTRAVENOUS
  Filled 2017-03-31 (×2): qty 1

## 2017-03-31 MED ORDER — ACETAMINOPHEN 325 MG PO TABS
650.0000 mg | ORAL_TABLET | Freq: Four times a day (QID) | ORAL | Status: DC | PRN
  Administered 2017-03-31 – 2017-04-03 (×5): 650 mg via ORAL
  Filled 2017-03-31 (×5): qty 2

## 2017-03-31 MED ORDER — ACETAMINOPHEN 650 MG RE SUPP: 650.0000 mg | Freq: Four times a day (QID) | RECTAL | Status: DC | PRN

## 2017-03-31 MED ORDER — SIMETHICONE 80 MG PO CHEW: 40.0000 mg | CHEWABLE_TABLET | Freq: Four times a day (QID) | ORAL | Status: DC | PRN

## 2017-03-31 MED ORDER — ENOXAPARIN SODIUM 40 MG/0.4ML ~~LOC~~ SOLN
40.0000 mg | SUBCUTANEOUS | Status: DC
  Administered 2017-04-01: 40 mg via SUBCUTANEOUS
  Filled 2017-03-31 (×2): qty 0.4

## 2017-03-31 MED ORDER — ONDANSETRON 4 MG PO TBDP: 4.0000 mg | ORAL_TABLET | Freq: Four times a day (QID) | ORAL | Status: DC | PRN

## 2017-03-31 MED ORDER — SODIUM CHLORIDE 0.9 % IV SOLN
INTRAVENOUS | Status: DC
  Administered 2017-03-31: 20:00:00 via INTRAVENOUS

## 2017-03-31 MED ORDER — METRONIDAZOLE IN NACL 5-0.79 MG/ML-% IV SOLN
500.0000 mg | Freq: Three times a day (TID) | INTRAVENOUS | Status: DC
  Administered 2017-03-31 – 2017-04-03 (×9): 500 mg via INTRAVENOUS
  Filled 2017-03-31 (×10): qty 100

## 2017-03-31 MED ORDER — METRONIDAZOLE IN NACL 5-0.79 MG/ML-% IV SOLN
500.0000 mg | Freq: Once | INTRAVENOUS | Status: AC
  Administered 2017-03-31: 500 mg via INTRAVENOUS
  Filled 2017-03-31: qty 100

## 2017-03-31 MED ORDER — ONDANSETRON HCL 4 MG/2ML IJ SOLN
4.0000 mg | Freq: Four times a day (QID) | INTRAMUSCULAR | Status: DC | PRN
Start: 2017-03-31 — End: 2017-04-03
  Administered 2017-03-31 – 2017-04-02 (×4): 4 mg via INTRAVENOUS
  Filled 2017-03-31 (×4): qty 2

## 2017-03-31 MED ORDER — IOPAMIDOL (ISOVUE-300) INJECTION 61%
INTRAVENOUS | Status: AC
  Administered 2017-03-31: 100 mL
  Filled 2017-03-31: qty 100

## 2017-03-31 NOTE — ED Notes (Signed)
Pt giving urine sample at this time.

## 2017-03-31 NOTE — ED Triage Notes (Signed)
Pt reports to the ED for eval of RLQ abd pain since yesterday. She states that she has hx of hernia to her lower abd and states that it was irritating her hernia yesterday but states this severe pain is different. Denies any protrusion from the hernia. Reports some loose stools and nausea. Denies any active vomiting.

## 2017-03-31 NOTE — ED Notes (Signed)
Patient transported to CT 

## 2017-03-31 NOTE — ED Provider Notes (Signed)
[facility] DEPT Provider Note   CSN: [id number] Arrival date & time: 03/31/17  2353     History   Chief Complaint Chief Complaint  Patient presents with  . Abdominal Pain    HPI Lori Barber is a 51 y.o. female.  HPI  51 year old female with a history of IBS presents today with complaints of abdominal pain.  Patient notes symptoms started yesterday with generalized abdominal discomfort.  She notes is localized to her periumbilical region and today down into her right lower quadrant.  She notes she has some nausea, she denies any vomiting.  She denies any fever at home, reports that walking makes her symptoms worse.  She notes she has had some loose stools, but denies any diarrhea or constipation.  Patient reports a history of abdominal hysterectomy, C-section, cholecystectomy.  Patient reports her last p.o. intake was last night at 8 PM.  Past Medical History:  Diagnosis Date  . Abdominal distention   . Abdominal pain, epigastric   . Allergy    mold,dust, animal dander  . Anal fissure   . Anemia   . Anxiety   . Esophageal reflux   . Esophageal stricture   . Esophagitis, unspecified   . Family history of malignant neoplasm of gastrointestinal tract   . Fatty liver   . Follicular cyst of ovary   . Hyperlipidemia   . Irritable bowel syndrome   . Obesity, unspecified   . Other chronic nonalcoholic liver disease   . Other chronic nonalcoholic liver disease   . Personal history of colonic polyps   . Type II or unspecified type diabetes mellitus without mention of complication, not stated as uncontrolled   . Unspecified gastritis and gastroduodenitis without mention of hemorrhage   . Unspecified hemorrhoids without mention of complication   . Unspecified hypothyroidism     Patient Active Problem List   Diagnosis Date Noted  . Chest pain 07/24/2013  . Diabetes mellitus type 2, diet-controlled ([place]) 07/24/2013  . Palpitations 07/24/2013  . Duodenitis 09/15/2011    . Gastritis 09/15/2011  . Nausea alone 09/15/2011  . GERD with stricture 09/15/2011  . Nausea 09/04/2011  . Diarrhea following gastrointestinal surgery 09/04/2011  . Hernia of unspecified site of abdominal cavity without mention of obstruction or gangrene 09/04/2011  . Obesity 09/04/2011  . S/P cholecystectomy 09/04/2011  . History of gastroesophageal reflux (GERD) 09/04/2011  . Ventral hernia 07/11/2011  . IBS (irritable bowel syndrome) 07/11/2011  . GERD (gastroesophageal reflux disease) 07/11/2011  . Fatty infiltration of liver 07/11/2011  . Bilateral ovarian cysts 07/11/2011  . Hx of cholecystectomy 07/11/2011  . ABDOMINAL WALL HERNIA 10/12/2009  . ARTHRALGIA 03/06/2009  . ESOPHAGITIS 01/22/2009  . GASTRITIS 12/08/2008  . OBESITY, UNSPECIFIED 12/07/2008  . ABDOMINAL PAIN-EPIGASTRIC 12/07/2008  . HYPOTHYROIDISM 11/09/2008  . HEMORRHOIDS 11/09/2008  . GERD 11/09/2008  . IBS 11/09/2008  . FATTY LIVER DISEASE 11/09/2008  . OVARIAN CYST 11/09/2008  . COLONIC POLYPS, ADENOMATOUS, HX OF 11/09/2008    Past Surgical History:  Procedure Laterality Date  . ABDOMINAL HYSTERECTOMY    . CESAREAN SECTION  1999  . CHOLECYSTECTOMY  2011  . COLONOSCOPY    . OVARIAN CYST REMOVAL  2002  . TONSILLECTOMY  1994  . UPPER GASTROINTESTINAL ENDOSCOPY      OB History    No data available       Home Medications    Prior to Admission medications   Medication Sig Start Date End Date Taking? Authorizing Provider  Multiple Vitamins-Minerals (  MULTIVITAMIN WITH MINERALS) tablet Take 1 tablet by mouth daily.   Yes [provider]  Omega-3 Fatty Acids (FISH OIL) 1200 MG CAPS Take 1,200 mg by mouth 2 (two) times daily.   Yes [provider]  cholecalciferol (VITAMIN D) 1000 units tablet Take 1,000 Units by mouth 2 (two) times daily.    [provider]  metroNIDAZOLE (FLAGYL) 500 MG tablet Take 1 tablet by mouth 2 (two) times daily. 06/15/16   [provider]     Family History Family History  Problem Relation Age of Onset  . Colon cancer Maternal Aunt        cousin  . Cancer Maternal Aunt        colon  . Breast cancer Mother   . Cancer Mother        breast  . Other Mother        car accident  . Other Father        liver failure  . Cancer Cousin        colon  . Colon cancer Cousin   . Esophageal cancer Maternal Uncle   . Colon cancer Maternal Uncle   . Heart disease Paternal Grandmother   . Rectal cancer Neg Hx   . Stomach cancer Neg Hx     Social History Social History  Substance Use Topics  . Smoking status: Current Every Day Smoker    Packs/day: 0.50    Years: 26.00    Types: Cigarettes    Start date: 11/07/1978  . Smokeless tobacco: Never Used  . Alcohol use No     Comment: occasionally     Allergies   Cleocin [clindamycin hcl]; Penicillins; Mushroom extract complex; Amoxicillin; and Clarithromycin   Review of Systems Review of Systems  All other systems reviewed and are negative.   Physical Exam Updated Vital Signs BP 113/65   Pulse 81   Temp 97.3 F (36.3 C) (Oral)   Resp 20   Ht 5\' 5"  (1.651 m)   Wt 108.9 kg (240 lb)   SpO2 96%   BMI 39.94 kg/m   Physical Exam  Constitutional: She is oriented to person, place, and time. She appears well-developed and well-nourished.  Obese   HENT:  Head: Normocephalic and atraumatic.  Eyes: Conjunctivae are normal. Pupils are equal, round, and reactive to light. Right eye exhibits no discharge. Left eye exhibits no discharge. No scleral icterus.  Neck: Normal range of motion. No JVD present. No tracheal deviation present.  Pulmonary/Chest: Effort normal. No stridor.  Abdominal: Soft.  Generalized abd TTP, worse to RLQ  Neurological: She is alert and oriented to person, place, and time. Coordination normal.  Psychiatric: She has a normal mood and affect. Her behavior is normal. Judgment and thought content normal.  Nursing note and vitals reviewed.    ED  Treatments / Results  Labs (all labs ordered are listed, but only abnormal results are displayed) Labs Reviewed  COMPREHENSIVE METABOLIC PANEL - Abnormal; Notable for the following:       Result Value   Glucose, Bld 108 (*)    All other components within normal limits  CBC - Abnormal; Notable for the following:    WBC 13.9 (*)    All other components within normal limits  URINALYSIS, ROUTINE W REFLEX MICROSCOPIC - Abnormal; Notable for the following:    APPearance HAZY (*)    Hgb urine dipstick SMALL (*)    Leukocytes, UA TRACE (*)    Bacteria, UA FEW (*)  Squamous Epithelial / LPF 6-30 (*)    All other components within normal limits  LIPASE, BLOOD    EKG  EKG Interpretation None       Radiology Ct Abdomen Pelvis W Contrast  Result Date: 03/31/2017 CLINICAL DATA:  Right lower quadrant pain for 2 days EXAM: CT ABDOMEN AND PELVIS WITH CONTRAST TECHNIQUE: Multidetector CT imaging of the abdomen and pelvis was performed using the standard protocol following bolus administration of intravenous contrast. CONTRAST:  112mL ISOVUE-300 IOPAMIDOL (ISOVUE-300) INJECTION 61% COMPARISON:  10/09/2009 FINDINGS: Lower chest: Dependent atelectasis Hepatobiliary: Postcholecystectomy.  Diffuse hepatic steatosis. Pancreas: Unremarkable Spleen: Unremarkable Adrenals/Urinary Tract: Kidneys and adrenal glands are unremarkable. Bladder is within normal limits. Stomach/Bowel: There are inflammatory changes and wall thickening of the cecum. The base of the appendix is also inflamed. There is stranding surrounding the base of the appendix and see gun. Small inflammatory nodes are seen surrounding the CPM. The tip of the appendix is relatively normal in appearance but only contains fluid. The terminal ileum is also relatively normal appearing other then at its junction with the see come. At that point, it is associated with inflammatory changes. Vascular/Lymphatic: Mild atherosclerotic calcifications of the  iliac arteries and distal aorta. Circumaortic left renal vein anatomy. No abnormal retroperitoneal adenopathy. Reproductive: Uterus is absent.  Adnexa are within normal limits. Other: There is a small amount of free fluid in the right hemipelvis. Ventral hernia contains adipose tissue on image 62. The hernia sac is larger than on the prior study. Musculoskeletal: No vertebral compression deformity. Bilateral L5 pars defects is associated with grade 1 L5-S1 spondylolisthesis. IMPRESSION: There is inflammation of the cecum common base of the appendix, and ileocecal valve. An inflammatory process of any of the structures may be present. The tip of the appendix has a relatively normal appearance, however acute appendicitis is not excluded. There is no evidence of extraluminal bowel gas to suggest rupture or abscess. Critical Value/emergent results were called by telephone at the time of interpretation on 03/31/2017 at 1:30 pm to Dr. Gareth Morgan, who verbally acknowledged these results. Electronically Signed   By: Marybelle Killings M.D.   On: 03/31/2017 13:31    Procedures Procedures (including critical care time)  Medications Ordered in ED Medications  morphine 4 MG/ML injection 4 mg (4 mg Intravenous Given 03/31/17 1044)  ondansetron (ZOFRAN) injection 4 mg (4 mg Intravenous Given 03/31/17 1045)  iopamidol (ISOVUE-300) 61 % injection (100 mLs  Contrast Given 03/31/17 1257)  HYDROmorphone (DILAUDID) injection 1 mg (1 mg Intravenous Given 03/31/17 1400)     Initial Impression / Assessment and Plan / ED Course  I have reviewed the triage vital signs and the nursing notes.  Pertinent labs & imaging results that were available during my care of the patient were reviewed by me and considered in my medical decision making (see chart for details).      Final Clinical Impressions(s) / ED Diagnoses   Final diagnoses:  RLQ abdominal pain  Acute appendicitis with localized peritonitis    Labs: lipase, cmp,  CBC  Imaging: CT abdomen and pelvis with contrast  Consults: General surgery  Therapeutics: Morphine, Dilaudid  Discharge Meds:   Assessment/Plan: Patient's presentation concerning for acute appendicitis.  Well-appearing in no acute distress, no comp gating features.  General surgery will be consulted for evaluation and management.  New Prescriptions New Prescriptions   No medications on file     Francee Gentile 03/31/17 1419    Gareth Morgan, MD 04/01/17 [phone]

## 2017-03-31 NOTE — H&P (Signed)
[facility] Admission Note  KOLLYNS MICKELSON [birth year]  [id number].    Requesting MD: Billy Fischer Chief Complaint/Reason for Consult: Abdominal pain  HPI:  Lori Barber is a 51yo female who presented to [facility] earlier today with 1 day of right sided abdominal pain. States that pain was generalized at first, then localized to the RLQ. It is constant and severe. Associated with nausea, subjective fevers, and chills. Denies vomiting, diarrhea, or dysuria. States that she has never had pain like this before. Tried Gas-X but this did not help, therefore she came to the ED.  Last meal was last night.   Hospital workup: - CT scan shows inflammation of the cecum common base of the appendix, and ileocecal valve; possible appendicitis; no evidence of extraluminal bowel gas to suggest rupture or abscess - WBC 13.9  No significant PMH Abdominal surgical history: lap chole, hysterectomy, c section, lap ovarian cyst removal Anticoagulants: none Smokes 1/2 PPD Employment: desk job  ROS: Review of Systems  Constitutional: Positive for chills and fever.  HENT: Negative.   Eyes: Negative.   Respiratory: Negative.   Cardiovascular: Negative.   Gastrointestinal: Positive for abdominal pain and nausea. Negative for blood in stool, constipation, diarrhea, melena and vomiting.  Genitourinary: Negative.   Musculoskeletal: Negative.   Skin: Negative.   Neurological: Negative.   All systems reviewed and otherwise negative except for as above  Family History  Problem Relation Age of Onset  . Colon cancer Maternal Aunt        cousin  . Cancer Maternal Aunt        colon  . Breast cancer Mother   . Cancer Mother        breast  . Other Mother        car accident  . Other Father        liver failure  . Cancer Cousin        colon  . Colon cancer Cousin   . Esophageal cancer Maternal Uncle   . Colon cancer Maternal Uncle   . Heart disease Paternal Grandmother   . Rectal cancer  Neg Hx   . Stomach cancer Neg Hx     Past Medical History:  Diagnosis Date  . Abdominal distention   . Abdominal pain, epigastric   . Allergy    mold,dust, animal dander  . Anal fissure   . Anemia   . Anxiety   . Esophageal reflux   . Esophageal stricture   . Esophagitis, unspecified   . Family history of malignant neoplasm of gastrointestinal tract   . Fatty liver   . Follicular cyst of ovary   . Hyperlipidemia   . Irritable bowel syndrome   . Obesity, unspecified   . Other chronic nonalcoholic liver disease   . Other chronic nonalcoholic liver disease   . Personal history of colonic polyps   . Type II or unspecified type diabetes mellitus without mention of complication, not stated as uncontrolled   . Unspecified gastritis and gastroduodenitis without mention of hemorrhage   . Unspecified hemorrhoids without mention of complication   . Unspecified hypothyroidism     Past Surgical History:  Procedure Laterality Date  . ABDOMINAL HYSTERECTOMY    . CESAREAN SECTION  1999  . CHOLECYSTECTOMY  2011  . COLONOSCOPY    . OVARIAN CYST REMOVAL  2002  . TONSILLECTOMY  1994  . UPPER GASTROINTESTINAL ENDOSCOPY      Social History:  reports that she has been smoking Cigarettes.  She started smoking about 38 years ago. She has a 13.00 pack-year smoking history. She has never used smokeless tobacco. She reports that she does not drink alcohol or use drugs.  Allergies:  Allergies  Allergen Reactions  . Cleocin [Clindamycin Hcl] Anaphylaxis and Shortness Of Breath    Blood pressure dropping  . Penicillins Anaphylaxis and Swelling  . Mushroom Extract Complex Nausea And Vomiting and Other (See Comments)    Headache   . Amoxicillin Nausea Only  . Clarithromycin Nausea And Vomiting     (Not in a hospital admission)  Prior to Admission medications   Medication Sig Start Date End Date Taking? Authorizing Provider  Multiple Vitamins-Minerals (MULTIVITAMIN WITH MINERALS) tablet  Take 1 tablet by mouth daily.   Yes [provider]  Omega-3 Fatty Acids (FISH OIL) 1200 MG CAPS Take 1,200 mg by mouth 2 (two) times daily.   Yes [provider]  cholecalciferol (VITAMIN D) 1000 units tablet Take 1,000 Units by mouth 2 (two) times daily.    [provider]  metroNIDAZOLE (FLAGYL) 500 MG tablet Take 1 tablet by mouth 2 (two) times daily. 06/15/16   [provider]    Blood pressure 113/65, pulse 81, temperature 97.3 F (36.3 C), temperature source Oral, resp. rate 20, height '5\' 5"'  (1.651 m), weight 240 lb (108.9 kg), SpO2 96 %. Physical Exam: General: pleasant, WD/WN white female who is laying in bed in NAD HEENT: head is normocephalic, atraumatic.  Sclera are noninjected.  Pupils equal and round.  Ears and nose without any masses or lesions.  Mouth is pink and moist. Dentition fair Heart: regular, rate, and rhythm.  No obvious murmurs, gallops, or rubs noted.  Palpable pedal pulses bilaterally Lungs: CTAB, no wheezes, rhonchi, or rales noted.  Respiratory effort nonlabored Abd: soft, ND, +BS, no masses, hernias, or organomegaly. +TTP RLQ with guarding. No rebound. MS: all 4 extremities are symmetrical with no cyanosis, clubbing, or edema. Skin: warm and dry with no masses, lesions, or rashes Psych: A&Ox3 with an appropriate affect. Neuro: cranial nerves grossly intact, extremity CSM intact bilaterally, normal speech  Results for orders placed or performed during the hospital encounter of 03/31/17 (from the past 48 hour(s))  Lipase, blood     Status: None   Collection Time: 03/31/17  9:31 AM  Result Value Ref Range   Lipase 33 11 - 51 U/L  Comprehensive metabolic panel     Status: Abnormal   Collection Time: 03/31/17  9:31 AM  Result Value Ref Range   Sodium 136 135 - 145 mmol/L   Potassium 4.5 3.5 - 5.1 mmol/L   Chloride 104 101 - 111 mmol/L   CO2 27 22 - 32 mmol/L   Glucose, Bld 108 (H) 65 - 99 mg/dL   BUN 9 6 - 20 mg/dL    Creatinine, Ser 0.85 0.44 - 1.00 mg/dL   Calcium 9.4 8.9 - 10.3 mg/dL   Total Protein 7.7 6.5 - 8.1 g/dL   Albumin 3.7 3.5 - 5.0 g/dL   AST 37 15 - 41 U/L   ALT 37 14 - 54 U/L   Alkaline Phosphatase 80 38 - 126 U/L   Total Bilirubin 0.8 0.3 - 1.2 mg/dL   GFR calc non Af Amer >60 >60 mL/min   GFR calc Af Amer >60 >60 mL/min    Comment: (NOTE) The eGFR has been calculated using the CKD EPI equation. This calculation has not been validated in all clinical situations. eGFR's persistently <60 mL/min signify  possible Chronic Kidney Disease.    Anion gap 5 5 - 15  CBC     Status: Abnormal   Collection Time: 03/31/17  9:31 AM  Result Value Ref Range   WBC 13.9 (H) 4.0 - 10.5 K/uL   RBC 4.52 3.87 - 5.11 MIL/uL   Hemoglobin 13.7 12.0 - 15.0 g/dL   HCT 41.4 36.0 - 46.0 %   MCV 91.6 78.0 - 100.0 fL   MCH 30.3 26.0 - 34.0 pg   MCHC 33.1 30.0 - 36.0 g/dL   RDW 13.4 11.5 - 15.5 %   Platelets 300 150 - 400 K/uL  Urinalysis, Routine w reflex microscopic     Status: Abnormal   Collection Time: 03/31/17 11:18 AM  Result Value Ref Range   Color, Urine YELLOW YELLOW   APPearance HAZY (A) CLEAR   Specific Gravity, Urine 1.020 1.005 - 1.030   pH 5.0 5.0 - 8.0   Glucose, UA NEGATIVE NEGATIVE mg/dL   Hgb urine dipstick SMALL (A) NEGATIVE   Bilirubin Urine NEGATIVE NEGATIVE   Ketones, ur NEGATIVE NEGATIVE mg/dL   Protein, ur NEGATIVE NEGATIVE mg/dL   Nitrite NEGATIVE NEGATIVE   Leukocytes, UA TRACE (A) NEGATIVE   RBC / HPF 0-5 0 - 5 RBC/hpf   WBC, UA 0-5 0 - 5 WBC/hpf   Bacteria, UA FEW (A) NONE SEEN   Squamous Epithelial / LPF 6-30 (A) NONE SEEN   Mucous PRESENT    Ct Abdomen Pelvis W Contrast  Result Date: 03/31/2017 CLINICAL DATA:  Right lower quadrant pain for 2 days EXAM: CT ABDOMEN AND PELVIS WITH CONTRAST TECHNIQUE: Multidetector CT imaging of the abdomen and pelvis was performed using the standard protocol following bolus administration of intravenous contrast. CONTRAST:  16m  ISOVUE-300 IOPAMIDOL (ISOVUE-300) INJECTION 61% COMPARISON:  10/09/2009 FINDINGS: Lower chest: Dependent atelectasis Hepatobiliary: Postcholecystectomy.  Diffuse hepatic steatosis. Pancreas: Unremarkable Spleen: Unremarkable Adrenals/Urinary Tract: Kidneys and adrenal glands are unremarkable. Bladder is within normal limits. Stomach/Bowel: There are inflammatory changes and wall thickening of the cecum. The base of the appendix is also inflamed. There is stranding surrounding the base of the appendix and see gun. Small inflammatory nodes are seen surrounding the CPM. The tip of the appendix is relatively normal in appearance but only contains fluid. The terminal ileum is also relatively normal appearing other then at its junction with the see come. At that point, it is associated with inflammatory changes. Vascular/Lymphatic: Mild atherosclerotic calcifications of the iliac arteries and distal aorta. Circumaortic left renal vein anatomy. No abnormal retroperitoneal adenopathy. Reproductive: Uterus is absent.  Adnexa are within normal limits. Other: There is a small amount of free fluid in the right hemipelvis. Ventral hernia contains adipose tissue on image 62. The hernia sac is larger than on the prior study. Musculoskeletal: No vertebral compression deformity. Bilateral L5 pars defects is associated with grade 1 L5-S1 spondylolisthesis. IMPRESSION: There is inflammation of the cecum common base of the appendix, and ileocecal valve. An inflammatory process of any of the structures may be present. The tip of the appendix has a relatively normal appearance, however acute appendicitis is not excluded. There is no evidence of extraluminal bowel gas to suggest rupture or abscess. Critical Value/emergent results were called by telephone at the time of interpretation on 03/31/2017 at 1:30 pm to Dr. EGareth Morgan who verbally acknowledged these results. Electronically Signed   By: AMarybelle KillingsM.D.   On: 03/31/2017 13:31       Assessment/Plan Abdominal pain and nausea RLQ  inflammation - abdominal surgical history: lap chole, hysterectomy, c section, lap ovarian cyst removal - 1 day of abdominal pain that has localized to the RLQ associated with nausea, subjective fevers, and chills - CT scan shows inflammation of the cecum common base of the appendix, and ileocecal valve; possible appendicitis; no evidence of extraluminal bowel gas to suggest rupture or abscess - WBC 13.9  Tobacco abuse  ID - cipro/flagyl VTE - SCDs FEN - IVF, NPO  Plan - Patient does have RLQ abdominal pain, and inflammation on CT scan in this area. She does not have clear appendicitis. Will admit to med-surg for observation and serial abdominal exams. NPO, IVF, pain control, antiemetics, antibiotics (cipro/flagyl). Recheck CBC/BMP in [place], [facility] Surgery 03/31/2017, 2:07 PM Pager: [phone] Consults: [phone] Mon-Fri 7:00 am-4:30 pm Sat-Sun 7:00 am-11:30 am

## 2017-04-01 ENCOUNTER — Encounter (HOSPITAL_COMMUNITY): Payer: Self-pay

## 2017-04-01 DIAGNOSIS — R1031 Right lower quadrant pain: Secondary | ICD-10-CM | POA: Diagnosis not present

## 2017-04-01 LAB — CBC
HCT: 39 % (ref 36.0–46.0)
Hemoglobin: 12.4 g/dL (ref 12.0–15.0)
MCH: 29.5 pg (ref 26.0–34.0)
MCHC: 31.8 g/dL (ref 30.0–36.0)
MCV: 92.9 fL (ref 78.0–100.0)
PLATELETS: 278 10*3/uL (ref 150–400)
RBC: 4.2 MIL/uL (ref 3.87–5.11)
RDW: 13.6 % (ref 11.5–15.5)
WBC: 11.6 10*3/uL — ABNORMAL HIGH (ref 4.0–10.5)

## 2017-04-01 LAB — BASIC METABOLIC PANEL
Anion gap: 6 (ref 5–15)
BUN: 7 mg/dL (ref 6–20)
CHLORIDE: 105 mmol/L (ref 101–111)
CO2: 25 mmol/L (ref 22–32)
CREATININE: 0.83 mg/dL (ref 0.44–1.00)
Calcium: 8.9 mg/dL (ref 8.9–10.3)
GFR calc non Af Amer: >60 mL/min (ref >60)
Glucose, Bld: 111 mg/dL — ABNORMAL HIGH (ref 65–99)
Potassium: 3.9 mmol/L (ref 3.5–5.1)
Sodium: 136 mmol/L (ref 135–145)

## 2017-04-01 MED ORDER — KCL IN DEXTROSE-NACL 20-5-0.45 MEQ/L-%-% IV SOLN
INTRAVENOUS | Status: DC
  Administered 2017-04-01 – 2017-04-03 (×4): via INTRAVENOUS
  Filled 2017-04-01 (×4): qty 1000

## 2017-04-01 NOTE — Progress Notes (Addendum)
Subjective/Chief Complaint: Feels better, passing more flatus, less bloated, less pain   Objective: Vital signs in last 24 hours: Temp:  [97.3 F (36.3 C)-98.7 F (37.1 C)] 98 F (36.7 C) (06/27 0546) Pulse Rate:  [72-101] 72 (06/27 0546) Resp:  [17-20] 18 (06/27 0546) BP: (106-125)/(55-83) 106/64 (06/27 0546) SpO2:  [92 %-97 %] 95 % (06/27 0546) Weight:  [108.9 kg (240 lb)] 108.9 kg (240 lb) (06/26 0929) Last BM Date: 03/30/17  Intake/Output from previous day: 06/26 0701 - 06/27 0700 In: 1418.3 [P.O.:60; I.V.:758.3; IV Piggyback:600] Out: -  Intake/Output this shift: Total I/O In: 511.7 [I.V.:411.7; IV Piggyback:100] Out: -   GI: soft mild tender suprapubic and rlq, no peritonitis  Lab Results:   Recent Labs  03/31/17 0931 04/01/17 0312  WBC 13.9* 11.6*  HGB 13.7 12.4  HCT 41.4 39.0  PLT 300 278   BMET  Recent Labs  03/31/17 0931 04/01/17 0312  NA 136 136  K 4.5 3.9  CL 104 105  CO2 27 25  GLUCOSE 108* 111*  BUN 9 7  CREATININE 0.85 0.83  CALCIUM 9.4 8.9   PT/INR No results for input(s): LABPROT, INR in the last 72 hours. ABG No results for input(s): PHART, HCO3 in the last 72 hours.  Invalid input(s): PCO2, PO2  Studies/Results: Ct Abdomen Pelvis W Contrast  Result Date: 03/31/2017 CLINICAL DATA:  Right lower quadrant pain for 2 days EXAM: CT ABDOMEN AND PELVIS WITH CONTRAST TECHNIQUE: Multidetector CT imaging of the abdomen and pelvis was performed using the standard protocol following bolus administration of intravenous contrast. CONTRAST:  156mL ISOVUE-300 IOPAMIDOL (ISOVUE-300) INJECTION 61% COMPARISON:  10/09/2009 FINDINGS: Lower chest: Dependent atelectasis Hepatobiliary: Postcholecystectomy.  Diffuse hepatic steatosis. Pancreas: Unremarkable Spleen: Unremarkable Adrenals/Urinary Tract: Kidneys and adrenal glands are unremarkable. Bladder is within normal limits. Stomach/Bowel: There are inflammatory changes and wall thickening of the  cecum. The base of the appendix is also inflamed. There is stranding surrounding the base of the appendix and see gun. Small inflammatory nodes are seen surrounding the CPM. The tip of the appendix is relatively normal in appearance but only contains fluid. The terminal ileum is also relatively normal appearing other then at its junction with the see come. At that point, it is associated with inflammatory changes. Vascular/Lymphatic: Mild atherosclerotic calcifications of the iliac arteries and distal aorta. Circumaortic left renal vein anatomy. No abnormal retroperitoneal adenopathy. Reproductive: Uterus is absent.  Adnexa are within normal limits. Other: There is a small amount of free fluid in the right hemipelvis. Ventral hernia contains adipose tissue on image 62. The hernia sac is larger than on the prior study. Musculoskeletal: No vertebral compression deformity. Bilateral L5 pars defects is associated with grade 1 L5-S1 spondylolisthesis. IMPRESSION: There is inflammation of the cecum common base of the appendix, and ileocecal valve. An inflammatory process of any of the structures may be present. The tip of the appendix has a relatively normal appearance, however acute appendicitis is not excluded. There is no evidence of extraluminal bowel gas to suggest rupture or abscess. Critical Value/emergent results were called by telephone at the time of interpretation on 03/31/2017 at 1:30 pm to Dr. Gareth Morgan, who verbally acknowledged these results. Electronically Signed   By: Marybelle Killings M.D.   On: 03/31/2017 13:31    Anti-infectives: Anti-infectives    Start     Dose/Rate Route Frequency Ordered Stop   04/01/17 0000  ciprofloxacin (CIPRO) IVPB 400 mg     400 mg 200 mL/hr over  60 Minutes Intravenous Every 12 hours 03/31/17 1532     03/31/17 2300  metroNIDAZOLE (FLAGYL) IVPB 500 mg     500 mg 100 mL/hr over 60 Minutes Intravenous Every 8 hours 03/31/17 1532     03/31/17 1430  ciprofloxacin  (CIPRO) IVPB 400 mg     400 mg 200 mL/hr over 60 Minutes Intravenous  Once 03/31/17 1429 03/31/17 1610   03/31/17 1430  metroNIDAZOLE (FLAGYL) IVPB 500 mg     500 mg 100 mL/hr over 60 Minutes Intravenous  Once 03/31/17 1429 03/31/17 1600      Assessment/Plan: RLQ pain, possible appendicitis  I think continued nonoperative treatment given presentation and ct scan is reasonable. She is better today. I will give her another 24 hours and if she is continuing to improve will manage nonoperatively with abx and likely csc 6 weeks or so in future. I am still concerned about cecum on the ct scan.  If she is not continuing to improve I will take her to or in am for lsc. Will give clears today. lovenox , scds   Addendum: ct shows likely cecal mass, discussed with gi next step is endoscopy  [facility] 04/01/2017

## 2017-04-02 ENCOUNTER — Encounter (HOSPITAL_COMMUNITY): Payer: Self-pay | Admitting: Physician Assistant

## 2017-04-02 ENCOUNTER — Inpatient Hospital Stay (HOSPITAL_COMMUNITY): Payer: 59

## 2017-04-02 DIAGNOSIS — R1031 Right lower quadrant pain: Secondary | ICD-10-CM | POA: Diagnosis not present

## 2017-04-02 DIAGNOSIS — K529 Noninfective gastroenteritis and colitis, unspecified: Secondary | ICD-10-CM | POA: Diagnosis not present

## 2017-04-02 DIAGNOSIS — R109 Unspecified abdominal pain: Secondary | ICD-10-CM | POA: Diagnosis not present

## 2017-04-02 DIAGNOSIS — R935 Abnormal findings on diagnostic imaging of other abdominal regions, including retroperitoneum: Secondary | ICD-10-CM

## 2017-04-02 DIAGNOSIS — K6389 Other specified diseases of intestine: Secondary | ICD-10-CM | POA: Diagnosis not present

## 2017-04-02 LAB — CBC
HCT: 38.9 % (ref 36.0–46.0)
HEMOGLOBIN: 12.3 g/dL (ref 12.0–15.0)
MCH: 29 pg (ref 26.0–34.0)
MCHC: 31.6 g/dL (ref 30.0–36.0)
MCV: 91.7 fL (ref 78.0–100.0)
PLATELETS: 267 10*3/uL (ref 150–400)
RBC: 4.24 MIL/uL (ref 3.87–5.11)
RDW: 13.4 % (ref 11.5–15.5)
WBC: 7.3 10*3/uL (ref 4.0–10.5)

## 2017-04-02 MED ORDER — IOPAMIDOL (ISOVUE-300) INJECTION 61%
100.0000 mL | Freq: Once | INTRAVENOUS | Status: AC | PRN
  Administered 2017-04-02: 100 mL via INTRAVENOUS

## 2017-04-02 MED ORDER — PEG-KCL-NACL-NASULF-NA ASC-C 100 G PO SOLR: 1.0000 | Freq: Once | ORAL | Status: DC

## 2017-04-02 MED ORDER — PEG-KCL-NACL-NASULF-NA ASC-C 100 G PO SOLR: 0.5000 | Freq: Once | ORAL | Status: DC

## 2017-04-02 MED ORDER — METOCLOPRAMIDE HCL 5 MG/ML IJ SOLN
10.0000 mg | Freq: Once | INTRAMUSCULAR | Status: AC
  Administered 2017-04-02: 10 mg via INTRAVENOUS

## 2017-04-02 MED ORDER — PEG-KCL-NACL-NASULF-NA ASC-C 100 G PO SOLR
0.5000 | Freq: Once | ORAL | Status: AC
Start: 2017-04-02 — End: 2017-04-02
  Administered 2017-04-02: 100 g via ORAL
  Filled 2017-04-02: qty 1

## 2017-04-02 MED ORDER — IOPAMIDOL (ISOVUE-300) INJECTION 61%
INTRAVENOUS | Status: AC
  Filled 2017-04-02: qty 30

## 2017-04-02 MED ORDER — HYDROMORPHONE HCL 1 MG/ML IJ SOLN: 0.5000 mg | INTRAMUSCULAR | Status: DC | PRN

## 2017-04-02 MED ORDER — IOPAMIDOL (ISOVUE-300) INJECTION 61%
INTRAVENOUS | Status: AC
  Filled 2017-04-02: qty 100

## 2017-04-02 MED ORDER — PEG-KCL-NACL-NASULF-NA ASC-C 100 G PO SOLR
0.5000 | Freq: Once | ORAL | Status: AC
  Administered 2017-04-03: 100 g via ORAL

## 2017-04-02 MED ORDER — PEG-KCL-NACL-NASULF-NA ASC-C 100 G PO SOLR
0.5000 | Freq: Once | ORAL | Status: DC
  Filled 2017-04-02: qty 1

## 2017-04-02 MED ORDER — METOCLOPRAMIDE HCL 5 MG/ML IJ SOLN: 10.0000 mg | Freq: Once | INTRAMUSCULAR | Status: DC

## 2017-04-02 MED ORDER — METOCLOPRAMIDE HCL 5 MG/ML IJ SOLN
10.0000 mg | Freq: Once | INTRAMUSCULAR | Status: AC
  Administered 2017-04-03: 10 mg via INTRAVENOUS
  Filled 2017-04-02 (×2): qty 2

## 2017-04-02 MED ORDER — BISACODYL 5 MG PO TBEC
10.0000 mg | DELAYED_RELEASE_TABLET | Freq: Once | ORAL | Status: AC
  Administered 2017-04-02: 10 mg via ORAL
  Filled 2017-04-02: qty 2

## 2017-04-02 NOTE — Consult Note (Signed)
[facility] Consult: 1:53 PM 04/02/2017  LOS: 2 days    Referring Provider: Dr Donne Hazel  Primary Care Physician:  Lori Alken, PA-C Primary Gastroenterologist:  Dr. Sharlett Iles >> Silvano Rusk MD   Reason for Consultation:  Mass in cecum.   HPI: Lori Barber is a 51 y.o. female.  PMH obesity.  IBS.  GERD.  s/p Cholecystectomy and ventral hernia repair 2011. S/p abdominal hysto with LOA 2002, c section, ovarian cystectomy.     05/1999 colonoscopy. Normal study. 12/2003 colonoscopy with polypectomy of hyperplastic polyps 01/2007 Colonoscopy for family history in aunt and first cousin of colorectal cancer: Normal study 12/2008 EGD: esophagitis, duodenitis 09/2011 EGD: gastritis, H Pylori negative.  Stool urease negative 09/2011.   04/2012 Colonoscopy.  Normal study. Suggested repeat study 04/2017.   02/2013 liver biopsy: steatohepatitis with perisinusoidal fibrosis.  No iron deposition.   This past Sunday she felt like her stomach was upset but she was not nauseated. Monday she had no appetite and ate cantaloupe all day. Later in the day she developed abdominal cramps and passed a loose stool and felt a little bit better. However within a few hours she was having pain in her mid to lower abdomen which eventually focused into the right lower quadrant. It became increasingly intense on a scale of 9/10. Other way home from work she tried taking some Gas-X but it did not help. She came to the emergency room on Tuesday morning. 03/31/17 CT scan #1: cecal inflammation at appendiceal base and IC valve.  Unable to exclude appendicitis. Hepatic steatosis.  Aortoiliac calcifications.    04/02/17 CT scan #2: severe hepatic steatosis. Mass like thickening in cecum at base of normal appearing appendix.  Ileocolic node enlarged.   Fat containing umbilical hernia. Aortic atherosclerosis.  WBCs 13.9 >> 7.3.  Hgb 12.3.  MCV 91.  Chemistries including lipase and LFTs normal.    Sxs have improved somewhat with Cipro, Flagyl.  She has tolerated clear liquid trays. She had her first bowel movement in 2 days, after drinking contrast for the CT scan today.  Were formed balls of brown stool. She has not seen any blood in her stool. Patient says that she had not been having anything in the way of GI symptoms until these recent days. She is to have a lot of IBS and GERD symptoms but she quit consuming gluten and her symptoms subsided. However 07/2016 TTG and IgA testing does not support a diagnosis of celiac disease.  Family history of colorectal cancer in aunt and first cousin.  Past Medical History:  Diagnosis Date  . Abdominal pain, epigastric   . Allergy    mold,dust, animal dander  . Anal fissure   . Anemia   . Anxiety   . Esophageal reflux   . Esophageal stricture   . Esophagitis, unspecified   . Family history of malignant neoplasm of gastrointestinal tract   . Fatty liver   . Follicular cyst of ovary   . Hyperlipidemia   . Irritable bowel syndrome   . Obesity, unspecified   .  Personal history of colonic polyps   . PONV (postoperative nausea and vomiting)   . Type II or unspecified type diabetes mellitus without mention of complication, not stated as uncontrolled   . Unspecified gastritis and gastroduodenitis without mention of hemorrhage   . Unspecified hemorrhoids without mention of complication   . Unspecified hypothyroidism     Past Surgical History:  Procedure Laterality Date  . ABDOMINAL HYSTERECTOMY    . CESAREAN SECTION  1999  . CHOLECYSTECTOMY  2011  . COLONOSCOPY    . OVARIAN CYST REMOVAL  2002  . TONSILLECTOMY  1994  . UPPER GASTROINTESTINAL ENDOSCOPY      Prior to Admission medications   Medication Sig Start Date End Date Taking? Authorizing Provider  Multiple Vitamins-Minerals (MULTIVITAMIN  WITH MINERALS) tablet Take 1 tablet by mouth daily.   Yes [provider]  Omega-3 Fatty Acids (FISH OIL) 1200 MG CAPS Take 1,200 mg by mouth 2 (two) times daily.   Yes [provider]  cholecalciferol (VITAMIN D) 1000 units tablet Take 1,000 Units by mouth 2 (two) times daily.    [provider]           Scheduled Meds: . enoxaparin (LOVENOX) injection  40 mg Subcutaneous Q24H  . iopamidol      . iopamidol       Infusions: . ciprofloxacin Stopped (04/02/17 1054)   And  . metronidazole Stopped (04/02/17 0933)  . dextrose 5 % and 0.45 % NaCl with KCl 20 mEq/L 75 mL/hr at 04/01/17 2109   PRN Meds: acetaminophen **OR** acetaminophen, HYDROmorphone (DILAUDID) injection, ondansetron **OR** ondansetron (ZOFRAN) IV, simethicone   Allergies as of 03/31/2017 - Review Complete 03/31/2017  Allergen Reaction Noted  . Cleocin [clindamycin hcl] Anaphylaxis and Shortness Of Breath 04/12/2012  . Penicillins Anaphylaxis and Swelling   . Mushroom extract complex Nausea And Vomiting and Other (See Comments) 10/02/2012  . Amoxicillin Nausea Only   . Clarithromycin Nausea And Vomiting     Family History  Problem Relation Age of Onset  . Colon cancer Maternal Aunt        cousin  . Cancer Maternal Aunt        colon  . Breast cancer Mother   . Cancer Mother        breast  . Other Mother        car accident  . Other Father        liver failure  . Cancer Cousin        colon  . Colon cancer Cousin   . Esophageal cancer Maternal Uncle   . Colon cancer Maternal Uncle   . Heart disease Paternal Grandmother   . Rectal cancer Neg Hx   . Stomach cancer Neg Hx     Social History   Social History  . Marital status: Married    Spouse name: N/A  . Number of children: 1  . Years of education: N/A   Occupational History  . Administrative assistant [employer]   Social History Main Topics  . Smoking status: Current Every Day Smoker    Packs/day: 0.50    Years: 26.00     Types: Cigarettes    Start date: 11/07/1978  . Smokeless tobacco: Never Used  . Alcohol use No     Comment: occasionally  . Drug use: No  . Sexual activity: Yes    Birth control/ protection: None   Other Topics Concern  . Not on file   Social History Narrative   Patient  is married with one daughter. 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 beverages daily, no alcohol   07/10/2016       REVIEW OF SYSTEMS: Constitutional:  Patient generally does not lack energy or experience weakness ENT:  No nose bleeds Pulm:  No shortness of breath, no cough. CV:  No palpitations, no LE edema. No chest pain GU:  No hematuria, no frequency GI:  See HPI Heme:  No unusual bleeding or bruising.   Transfusions:  Does not recall recent transfusions. Neuro:  Morphine, administered here in the hospital, has given her headache.  no peripheral tingling or numbness Derm:  No itching, no rash or sores.  Endocrine:  No sweats or chills.  No polyuria or dysuria Immunization:  Did not inquire as to recent immunizations. Travel:  None beyond local counties in last few months.    PHYSICAL EXAM: Vital signs in last 24 hours: Vitals:   04/01/17 2101 04/02/17 0549  BP: 95/68 (!) 109/59  Pulse: 74 61  Resp: 18 18  Temp: 98.6 F (37 C) 97.7 F (36.5 C)   Wt Readings from Last 3 Encounters:  03/31/17 108.9 kg (240 lb)  07/08/16 112.5 kg (248 lb 2 oz)  09/03/15 111.1 kg (245 lb)    General: Obese, pleasant, comfortable, nontoxic WF Head:  No asymmetry or swelling. No signs of head trauma.  Eyes:  No scleral icterus, no conjunctival pallor. Ears:  Not hard of hearing  Nose:  No congestion or discharge Mouth:  Oral mucosa is moist, pink clear. Excellent dentition. Tongue midline. Neck:  No JVD, no masses, no thyromegaly. Lungs:  Clear to auscultation and percussion bilaterally. No labored breathing or cough. Heart: RRR. No MRG. S1, S2 present. Abdomen:  Obese. Focal tenderness  without guarding or rebound in the right lower quadrant. No masses or organomegaly appreciated. Ventral hernia..   Rectal: Deferred   Musc/Skeltl: No joint erythema, gross deformity or contracture. Extremities:  No CCE.  Neurologic:  Patient is fully alert. Good historian. Oriented times 3. No limb weakness or tremor. Skin:  No telangiectasia, rashes or sores. Tattoos:  None seen Nodes:  No cervical adenopathy.   Psych:  Pleasant, calm, cooperative.  Intake/Output from previous day: 06/27 0701 - 06/28 0700 In: 3570.4 [P.O.:1260; I.V.:1710.4; IV Piggyback:600] Out: 200 [Urine:200] Intake/Output this shift: Total I/O In: 60 [P.O.:60] Out: -   LAB RESULTS:  Recent Labs  03/31/17 0931 04/01/17 0312 04/02/17 0508  WBC 13.9* 11.6* 7.3  HGB 13.7 12.4 12.3  HCT 41.4 39.0 38.9  PLT 300 278 267   BMET Lab Results  Component Value Date   NA 136 04/01/2017   NA 136 03/31/2017   NA 139 09/07/2015   K 3.9 04/01/2017   K 4.5 03/31/2017   K 4.2 09/07/2015   CL 105 04/01/2017   CL 104 03/31/2017   CL 105 09/07/2015   CO2 25 04/01/2017   CO2 27 03/31/2017   CO2 28 09/07/2015   GLUCOSE 111 (H) 04/01/2017   GLUCOSE 108 (H) 03/31/2017   GLUCOSE 106 (H) 09/07/2015   BUN 7 04/01/2017   BUN 9 03/31/2017   BUN 12 09/07/2015   CREATININE 0.83 04/01/2017   CREATININE 0.85 03/31/2017   CREATININE 0.73 09/07/2015   CALCIUM 8.9 04/01/2017   CALCIUM 9.4 03/31/2017   CALCIUM 8.9 09/07/2015   LFT  Recent Labs  03/31/17 0931  PROT 7.7  ALBUMIN 3.7  AST 37  ALT 37  ALKPHOS 80  BILITOT  0.8   PT/INR Lab Results  Component Value Date   INR 1.04 03/04/2013   INR 1.1 (H) 02/04/2013   INR 1.0 ratio 01/23/2009   Hepatitis Panel No results for input(s): HEPBSAG, HCVAB, HEPAIGM, HEPBIGM in the last 72 hours. C-Diff No components found for: CDIFF Lipase     Component Value Date/Time   LIPASE 33 03/31/2017 0931    Drugs of Abuse  No results found for: LABOPIA, COCAINSCRNUR,  LABBENZ, AMPHETMU, THCU, LABBARB   RADIOLOGY STUDIES: Ct Abdomen Pelvis W Contrast  Result Date: 04/02/2017 CLINICAL DATA:  51 year old female with history of right lower quadrant abdominal pain. Evaluate for acute appendicitis. EXAM: CT ABDOMEN AND PELVIS WITH CONTRAST TECHNIQUE: Multidetector CT imaging of the abdomen and pelvis was performed using the standard protocol following bolus administration of intravenous contrast. CONTRAST:  136mL ISOVUE-300 IOPAMIDOL (ISOVUE-300) INJECTION 61% COMPARISON:  CT the abdomen and pelvis 03/31/2017. FINDINGS: Lower chest: Unremarkable. Hepatobiliary: Diffuse low attenuation throughout the hepatic parenchyma, compatible with severe hepatic steatosis. No definite cystic or solid hepatic lesions. No intra or extrahepatic biliary ductal dilatation. Status post cholecystectomy. Pancreas: No pancreatic mass. No pancreatic ductal dilatation. No pancreatic or peripancreatic fluid or inflammatory changes. Spleen: Unremarkable. Adrenals/Urinary Tract: Bilateral kidneys and bilateral adrenal glands are normal in appearance. There is no hydroureteronephrosis. Urinary bladder is normal in appearance. Stomach/Bowel: The appearance of the stomach is normal. There is no pathologic dilatation of small bowel or colon. There is mass-like thickening of the cecum which encompasses the base of the appendix, best appreciated on axial image 70 of series 3. This mass-like area of soft tissue thickening measures up to approximately 2.5 x 4.5 x 3.3 cm. This soft tissue thickening from this mass like area extends into the base of the appendix, however, the appendiceal lumen but is patent and contains oral contrast material. The appendix itself is mildly dilated measuring up to 8-9 mm in diameter, but there are no gross inflammatory changes around the appendix itself. There are inflammatory changes adjacent to the mass-like area of thickening in the cecum. Vascular/Lymphatic: Aortic  atherosclerosis, without evidence of aneurysm or dissection in the abdominal or pelvic vasculature. Mildly enlarged ileocolic lymph node measuring up to 11 mm in short axis (axial image 65 of series 3). No other lymphadenopathy noted in the abdomen or pelvis. Reproductive: Status post hysterectomy. Ovaries are not confidently identified may be surgically absent or atrophic. Other: Moderate to large infraumbilical ventral hernia containing omental fat. No significant volume of ascites. No pneumoperitoneum. Musculoskeletal: There are no aggressive appearing lytic or blastic lesions noted in the visualized portions of the skeleton. IMPRESSION: 1. There is mass-like thickening in the cecum centered around the base of the appendix where there are surrounding inflammatory changes. The appendix itself is mildly enlarged measuring up to 8-9 mm in diameter, but the appendiceal lumen appears to be patent given the internal oral contrast material. Additionally, the appendix itself does not appear overtly inflamed, although there is some appendiceal mural thickening. Overall, the findings are concerning for potential primary colonic neoplasm in the cecum partially occluding the appendiceal lumen. Surgical consultation is recommended. 2. Mildly enlarged 11 mm short axis ileocolic lymph node. 3. Aortic atherosclerosis. 4. Hepatic steatosis. Electronically Signed   By: Vinnie Langton M.D.   On: 04/02/2017 12:29     IMPRESSION:   *  Cecal mass with right lower quadrant pain. Rule out neoplasm. At this point radiology reading and surgical reading as well as opinion is that this is  not appendicitis. Slight improvement, but not resolution, of symptoms with day 3 of Cipro and Flagyl.    *  History hyperplastic colon polyps. Although the chart mentions history of adenomatous polyps, I don't find evidence to support anything other than hyperplastic polyps in 2005.    *  Hepatic steatosis. Underwent liver biopsy  2014    PLAN:     *  Plan colonoscopy. Not sure if we can get this arranged for tomorrow or we will have to defer it to the following day.  Since she's been tolerating clear liquids, and she tolerated oral contrast for CT scan today, I think that she would tolerate the colonoscopy prep.   Azucena Freed  04/02/2017, 1:53 PM Pager: [phone]  GI ATTENDING  History, laboratories, x-rays, prior endoscopy reports reviewed. Patient personally seen and examined. Several friends in her room. Agree with comprehensive consultation note as outlined above. Patient presented with progressively worsening abdominal pain which localized to the right. Abnormalities on imaging in the region of the cecum. Elevated white blood cell count. Not felt to have appendicitis. Still sore but all parameters improving. Question of cecal mass by imaging. Colonoscopy requested. Last colonoscopy 2013 with excellent views of the cecum without abnormality. She has had 4 prior colonoscopies. She could have neoplasia, but the clinical presentation is more consistent with acute ischemia or other infectious/inflammatory process. Regardless, agree with plans for colonoscopy to assess more fully.The nature of the procedure, as well as the risks, benefits, and alternatives were carefully and thoroughly reviewed with the patient. Ample time for discussion and questions allowed. The patient understood, was satisfied, and agreed to proceed.  Docia Chuck. Geri Seminole., M.D. [facility] Division of Gastroenterology

## 2017-04-03 ENCOUNTER — Inpatient Hospital Stay (HOSPITAL_COMMUNITY): Payer: 59 | Admitting: Certified Registered Nurse Anesthetist

## 2017-04-03 ENCOUNTER — Encounter (HOSPITAL_COMMUNITY): Payer: Self-pay | Admitting: *Deleted

## 2017-04-03 ENCOUNTER — Encounter (HOSPITAL_COMMUNITY)
Admission: EM | Disposition: A | Discharge status: Home / Self Care | Payer: Self-pay | Source: Home / Self Care | Attending: Emergency Medicine

## 2017-04-03 DIAGNOSIS — K6389 Other specified diseases of intestine: Secondary | ICD-10-CM

## 2017-04-03 DIAGNOSIS — R1031 Right lower quadrant pain: Secondary | ICD-10-CM | POA: Diagnosis not present

## 2017-04-03 DIAGNOSIS — K529 Noninfective gastroenteritis and colitis, unspecified: Secondary | ICD-10-CM | POA: Diagnosis not present

## 2017-04-03 DIAGNOSIS — K649 Unspecified hemorrhoids: Secondary | ICD-10-CM | POA: Diagnosis not present

## 2017-04-03 DIAGNOSIS — R935 Abnormal findings on diagnostic imaging of other abdominal regions, including retroperitoneum: Secondary | ICD-10-CM | POA: Diagnosis not present

## 2017-04-03 DIAGNOSIS — K209 Esophagitis, unspecified: Secondary | ICD-10-CM | POA: Diagnosis not present

## 2017-04-03 DIAGNOSIS — K51918 Ulcerative colitis, unspecified with other complication: Secondary | ICD-10-CM | POA: Diagnosis not present

## 2017-04-03 HISTORY — PX: COLONOSCOPY: SHX5424

## 2017-04-03 SURGERY — COLONOSCOPY
Anesthesia: Monitor Anesthesia Care

## 2017-04-03 MED ORDER — PROPOFOL 10 MG/ML IV BOLUS
INTRAVENOUS | Status: DC | PRN
  Administered 2017-04-03: 30 mg via INTRAVENOUS
  Administered 2017-04-03 (×2): 20 mg via INTRAVENOUS

## 2017-04-03 MED ORDER — LACTATED RINGERS IV SOLN
INTRAVENOUS | Status: DC
  Administered 2017-04-03: 13:00:00 via INTRAVENOUS

## 2017-04-03 MED ORDER — PROPOFOL 500 MG/50ML IV EMUL
INTRAVENOUS | Status: DC | PRN
  Administered 2017-04-03: 75 ug/kg/min via INTRAVENOUS

## 2017-04-03 MED ORDER — METRONIDAZOLE 500 MG PO TABS: 500.0000 mg | ORAL_TABLET | Freq: Three times a day (TID) | ORAL | 0 refills | Status: AC

## 2017-04-03 MED ORDER — LIDOCAINE 2% (20 MG/ML) 5 ML SYRINGE
INTRAMUSCULAR | Status: DC | PRN
  Administered 2017-04-03: 80 mg via INTRAVENOUS

## 2017-04-03 MED ORDER — CIPROFLOXACIN HCL 500 MG PO TABS: 500.0000 mg | ORAL_TABLET | Freq: Two times a day (BID) | ORAL | 0 refills | Status: AC

## 2017-04-03 NOTE — Progress Notes (Signed)
Discharge home. Home discharge instruction given, no question verbalized. 

## 2017-04-03 NOTE — Care Management Note (Signed)
Case Management Note  Patient Details  Name: Lori Barber MRN: [id number] Date of Birth: 1966-02-21  Subjective/Objective:                    Action/Plan: Pt discharging home with self care. Pt has insurance, PCP and transportation home. No further needs per CM.   Expected Discharge Date:  04/03/17               Expected Discharge Plan:  Home/Self Care  In-House Referral:     Discharge planning Services     Post Acute Care Choice:    Choice offered to:     DME Arranged:    DME Agency:     HH Arranged:    HH Agency:     Status of Service:  Completed, signed off  If discussed at [employer] of Stay Meetings, dates discussed:    Additional Comments:  Pollie Friar, RN 04/03/2017, 4:41 PM

## 2017-04-03 NOTE — Discharge Summary (Signed)
Physician Discharge Summary  Patient ID: Lori Barber MRN: [id number] DOB/AGE: 1965-11-20 51 y.o.  Admit date: 03/31/2017 Discharge date: 04/03/2017  Admission Diagnoses: rlq pain possible appendicitis  Discharge Diagnoses:  Active Problems:   RLQ abdominal pain   Abnormal CT of the abdomen   Inflammation of colonic mucosa   Discharged Condition: good  Hospital Course: 10 yof who was admitted with right lower quadrant pain and inflammation on ct scan.  Wbc was elevated.  There was concern for appendicitis but I was not convinced. She got better on iv antibiotics in terms of wbc but still had some tenderness.  A ct was repeated and there was concern for colonic mass.  She underwent colonoscopy with Dr Henrene Pastor that was normal but did show evidence of appendicitis.  She is now really nontender, has bowel function. I am going to send home on antibiotics now and see back in 2 weeks. Discussed warning signs of failure and will have discussion about interval appy vs observation when I see her back.   Consults: GI  Significant Diagnostic Studies: colonoscopy  Treatments: iv abx    Disposition: 01-Home or Self Care   Allergies as of 04/03/2017      Reactions   Cleocin [clindamycin Hcl] Anaphylaxis, Shortness Of Breath   Blood pressure dropping   Penicillins Anaphylaxis, Swelling   Mushroom Extract Complex Nausea And Vomiting, Other (See Comments)   Headache   Amoxicillin Nausea Only   Clarithromycin Nausea And Vomiting      Medication List    STOP taking these medications   cholecalciferol 1000 units tablet Commonly known as:  VITAMIN D     TAKE these medications   ciprofloxacin 500 MG tablet Commonly known as:  CIPRO Take 1 tablet (500 mg total) by mouth 2 (two) times daily.   Fish Oil 1200 MG Caps Take 1,200 mg by mouth 2 (two) times daily.   metroNIDAZOLE 500 MG tablet Commonly known as:  FLAGYL Take 1 tablet (500 mg total) by mouth 3 (three) times daily. What  changed:  when to take this   multivitamin with minerals tablet Take 1 tablet by mouth daily.      Follow-up Information    Rolm Bookbinder, MD Follow up in 2 week(s).   Specialty:  General Surgery Contact information: [address]  [place] [address] [phone]           Signed: Rolm Bookbinder 04/03/2017, 3:12 PM

## 2017-04-03 NOTE — Anesthesia Postprocedure Evaluation (Signed)
Anesthesia Post Note  Patient: Lori Barber  Procedure(s) Performed: Procedure(s) (LRB): COLONOSCOPY (N/A)     Patient location during evaluation: PACU Anesthesia Type: MAC Level of consciousness: awake and alert Pain management: pain level controlled Vital Signs Assessment: post-procedure vital signs reviewed and stable Respiratory status: spontaneous breathing, nonlabored ventilation, respiratory function stable and patient connected to nasal cannula oxygen Cardiovascular status: stable and blood pressure returned to baseline Anesthetic complications: no    Last Vitals:  Vitals:   04/03/17 1357 04/03/17 1419  BP: 103/64 107/69  Pulse:  62  Resp: 14 18  Temp:  36.6 C    Last Pain:  Vitals:   04/03/17 1419  TempSrc: Oral  PainSc:                  Ryan P Ellender

## 2017-04-03 NOTE — Transfer of Care (Signed)
Immediate Anesthesia Transfer of Care Note  Patient: Lori Barber  Procedure(s) Performed: Procedure(s): COLONOSCOPY (N/A)  Patient Location: Endoscopy Unit  Anesthesia Type:MAC  Level of Consciousness: awake, alert  and oriented  Airway & Oxygen Therapy: Patient Spontanous Breathing and Patient connected to nasal cannula oxygen  Post-op Assessment: Report given to RN and Post -op Vital signs reviewed and stable  Post vital signs: Reviewed and stable  Last Vitals:  Vitals:   04/03/17 1223 04/03/17 1346  BP: 126/71 108/69  Pulse: 71   Resp: 12 13  Temp: 36.7 C 36.6 C    Last Pain:  Vitals:   04/03/17 1346  TempSrc: Oral  PainSc:       Patients Stated Pain Goal: 2 (86/38/17 7116)  Complications: No apparent anesthesia complications

## 2017-04-03 NOTE — Anesthesia Preprocedure Evaluation (Addendum)
Anesthesia Evaluation  Patient identified by MRN, date of birth, ID band Patient awake    Reviewed: Allergy & Precautions, NPO status , Patient's Chart, lab work & pertinent test results  History of Anesthesia Complications (+) PONV and history of anesthetic complications  Airway Mallampati: III  TM Distance: >3 FB Neck ROM: Full    Dental no notable dental hx.    Pulmonary Current Smoker,    Pulmonary exam normal breath sounds clear to auscultation       Cardiovascular negative cardio ROS Normal cardiovascular exam Rhythm:Regular Rate:Normal  ECG: SR, rate 92   Neuro/Psych Anxiety negative neurological ROS     GI/Hepatic Neg liver ROS,   Endo/Other  Hypothyroidism Morbid obesity  Renal/GU negative Renal ROS  negative genitourinary   Musculoskeletal negative musculoskeletal ROS (+)   Abdominal (+) + obese,   Peds negative pediatric ROS (+)  Hematology negative hematology ROS (+)   Anesthesia Other Findings   Reproductive/Obstetrics negative OB ROS                            Anesthesia Physical Anesthesia Plan  ASA: II  Anesthesia Plan: MAC   Post-op Pain Management:    Induction: Intravenous  PONV Risk Score and Plan: 2 and Propofol  Airway Management Planned:   Additional Equipment:   Intra-op Plan:   Post-operative Plan:   Informed Consent: I have reviewed the patients History and Physical, chart, labs and discussed the procedure including the risks, benefits and alternatives for the proposed anesthesia with the patient or authorized representative who has indicated his/her understanding and acceptance.   Dental advisory given  Plan Discussed with: CRNA  Anesthesia Plan Comments:        Anesthesia Quick Evaluation

## 2017-04-03 NOTE — Interval H&P Note (Signed)
History and Physical Interval Note:  04/03/2017 12:24 PM  Lori Barber  has presented today for surgery, with the diagnosis of mass at cecum  The various methods of treatment have been discussed with the patient and family. After consideration of risks, benefits and other options for treatment, the patient has consented to  Procedure(s): COLONOSCOPY (N/A) as a surgical intervention .  The patient's history has been reviewed, patient examined, no change in status, stable for surgery.  I have reviewed the patient's chart and labs.  Questions were answered to the patient's satisfaction.     Scarlette Shorts

## 2017-04-03 NOTE — Progress Notes (Signed)
Subjective/Chief Complaint: Much less abd pain, prep overnight feels better   Objective: Vital signs in last 24 hours: Temp:  [98.1 F (36.7 C)-98.3 F (36.8 C)] 98.1 F (36.7 C) (06/29 0552) Pulse Rate:  [66-74] 66 (06/29 0552) Resp:  [16-18] 16 (06/29 0552) BP: (107-119)/(69-77) 107/77 (06/29 0552) SpO2:  [95 %-98 %] 98 % (06/29 0552) Last BM Date: 04/02/17  Intake/Output from previous day: 06/28 0701 - 06/29 0700 In: 2377.5 [P.O.:660; I.V.:1417.5; IV Piggyback:300] Out: -  Intake/Output this shift: No intake/output data recorded.  GI: soft minimally tender rlq  Lab Results:   Recent Labs  04/01/17 0312 04/02/17 0508  WBC 11.6* 7.3  HGB 12.4 12.3  HCT 39.0 38.9  PLT 278 267   BMET  Recent Labs  03/31/17 0931 04/01/17 0312  NA 136 136  K 4.5 3.9  CL 104 105  CO2 27 25  GLUCOSE 108* 111*  BUN 9 7  CREATININE 0.85 0.83  CALCIUM 9.4 8.9   PT/INR No results for input(s): LABPROT, INR in the last 72 hours. ABG No results for input(s): PHART, HCO3 in the last 72 hours.  Invalid input(s): PCO2, PO2  Studies/Results: Ct Abdomen Pelvis W Contrast  Result Date: 04/02/2017 CLINICAL DATA:  51 year old female with history of right lower quadrant abdominal pain. Evaluate for acute appendicitis. EXAM: CT ABDOMEN AND PELVIS WITH CONTRAST TECHNIQUE: Multidetector CT imaging of the abdomen and pelvis was performed using the standard protocol following bolus administration of intravenous contrast. CONTRAST:  137mL ISOVUE-300 IOPAMIDOL (ISOVUE-300) INJECTION 61% COMPARISON:  CT the abdomen and pelvis 03/31/2017. FINDINGS: Lower chest: Unremarkable. Hepatobiliary: Diffuse low attenuation throughout the hepatic parenchyma, compatible with severe hepatic steatosis. No definite cystic or solid hepatic lesions. No intra or extrahepatic biliary ductal dilatation. Status post cholecystectomy. Pancreas: No pancreatic mass. No pancreatic ductal dilatation. No pancreatic or  peripancreatic fluid or inflammatory changes. Spleen: Unremarkable. Adrenals/Urinary Tract: Bilateral kidneys and bilateral adrenal glands are normal in appearance. There is no hydroureteronephrosis. Urinary bladder is normal in appearance. Stomach/Bowel: The appearance of the stomach is normal. There is no pathologic dilatation of small bowel or colon. There is mass-like thickening of the cecum which encompasses the base of the appendix, best appreciated on axial image 70 of series 3. This mass-like area of soft tissue thickening measures up to approximately 2.5 x 4.5 x 3.3 cm. This soft tissue thickening from this mass like area extends into the base of the appendix, however, the appendiceal lumen but is patent and contains oral contrast material. The appendix itself is mildly dilated measuring up to 8-9 mm in diameter, but there are no gross inflammatory changes around the appendix itself. There are inflammatory changes adjacent to the mass-like area of thickening in the cecum. Vascular/Lymphatic: Aortic atherosclerosis, without evidence of aneurysm or dissection in the abdominal or pelvic vasculature. Mildly enlarged ileocolic lymph node measuring up to 11 mm in short axis (axial image 65 of series 3). No other lymphadenopathy noted in the abdomen or pelvis. Reproductive: Status post hysterectomy. Ovaries are not confidently identified may be surgically absent or atrophic. Other: Moderate to large infraumbilical ventral hernia containing omental fat. No significant volume of ascites. No pneumoperitoneum. Musculoskeletal: There are no aggressive appearing lytic or blastic lesions noted in the visualized portions of the skeleton. IMPRESSION: 1. There is mass-like thickening in the cecum centered around the base of the appendix where there are surrounding inflammatory changes. The appendix itself is mildly enlarged measuring up to 8-9 mm in diameter, but  the appendiceal lumen appears to be patent given the internal  oral contrast material. Additionally, the appendix itself does not appear overtly inflamed, although there is some appendiceal mural thickening. Overall, the findings are concerning for potential primary colonic neoplasm in the cecum partially occluding the appendiceal lumen. Surgical consultation is recommended. 2. Mildly enlarged 11 mm short axis ileocolic lymph node. 3. Aortic atherosclerosis. 4. Hepatic steatosis. Electronically Signed   By: Vinnie Langton M.D.   On: 04/02/2017 12:29    Anti-infectives: Anti-infectives    Start     Dose/Rate Route Frequency Ordered Stop   04/01/17 0000  ciprofloxacin (CIPRO) IVPB 400 mg     400 mg 200 mL/hr over 60 Minutes Intravenous Every 12 hours 03/31/17 1532     03/31/17 2300  metroNIDAZOLE (FLAGYL) IVPB 500 mg     500 mg 100 mL/hr over 60 Minutes Intravenous Every 8 hours 03/31/17 1532     03/31/17 1430  ciprofloxacin (CIPRO) IVPB 400 mg     400 mg 200 mL/hr over 60 Minutes Intravenous  Once 03/31/17 1429 03/31/17 1610   03/31/17 1430  metroNIDAZOLE (FLAGYL) IVPB 500 mg     500 mg 100 mL/hr over 60 Minutes Intravenous  Once 03/31/17 1429 03/31/17 1600      Assessment/Plan: RLQ mass/inflammation colitis?  Will continue abx Csc today pending results will plan dispo lovenox scds  [facility] 04/03/2017

## 2017-04-03 NOTE — Op Note (Signed)
[facility] Patient Name: Lori Barber Procedure Date : 04/03/2017 MRN: [id number] Attending MD: Docia Chuck. Henrene Pastor , MD Date of Birth: 12-Jun-1966 CSN: [id number] Age: 51 Admit Type: Inpatient Procedure:                Colonoscopy, with biopsies Indications:              Abnormal CT of the GI tract. Question cecal mass,                            right lower quadrant pain, leukocytosis. Multiple                            previous colonoscopies with Dr. Sharlett Iles. Last                            examination 2013 was normal Providers:                Docia Chuck. Henrene Pastor, MD, Cleda Daub, RN, Cherylynn Ridges, Technician, Raphael Gibney, CRNA Referring MD:              Medicines:                Monitored Anesthesia Care Complications:            No immediate complications. Estimated blood loss:                            None. Estimated Blood Loss:     Estimated blood loss: none. Procedure:                Pre-Anesthesia Assessment:                           - Prior to the procedure, a History and Physical                            was performed, and patient medications and                            allergies were reviewed. The patient's tolerance of                            previous anesthesia was also reviewed. The risks                            and benefits of the procedure and the sedation                            options and risks were discussed with the patient.                            All questions were answered, and informed consent  was obtained. Prior Anticoagulants: The patient has                            taken no previous anticoagulant or antiplatelet                            agents. ASA Grade Assessment: II - A patient with                            mild systemic disease. After reviewing the risks                            and benefits, the patient was deemed in                            satisfactory  condition to undergo the procedure.                           After obtaining informed consent, the colonoscope                            was passed under direct vision. Throughout the                            procedure, the patient's blood pressure, pulse, and                            oxygen saturations were monitored continuously. The                            EC-[id number] ([id number]) scope was introduced through                            the anus and advanced to the the cecum, identified                            by appendiceal orifice and ileocecal valve. The                            terminal ileum, ileocecal valve, appendiceal                            orifice, and rectum were photographed. The quality                            of the bowel preparation was excellent. The                            colonoscopy was performed without difficulty. The                            patient tolerated the procedure well. The bowel  preparation used was MoviPrep. Scope In: 1:16:34 PM Scope Out: 1:38:00 PM Scope Withdrawal Time: 0 hours 14 minutes 8 seconds  Total Procedure Duration: 0 hours 21 minutes 26 seconds  Findings:      An area of mildly congested mucosa was found appendiceal orifice. There       appeared to be purulent material emanating from the orifice. This was       biopsied with a cold forceps for histology. There was no significant       cecal or colonic inflammation otherwise. There was no cecal mass.      The terminal ileum appeared normal.      The exam was otherwise without abnormality on direct and retroflexion       views. Impression:               - Congested mucosa at the appendiceal orifice with                            purulent material emanating from the orifice                            (biopsied). Question appendicitis. No inflammatory                            process of the cecum. No cecal mass.                           -  Normal terminal ileum.                           - The examination was otherwise normal on direct                            and retroflexion views. Moderate Sedation:      none Recommendation:           - Repeat colonoscopy in 5 years for surveillance                            (personal and family history).                           - Case discussed with Dr. Donne Hazel.                           - Full liquid diet.                           - Continue present medications.                           - Await pathology results. Procedure Code(s):        --- Professional ---                           [phone], Colonoscopy, flexible; diagnostic, including                            collection of  specimen(s) by brushing or washing,                            when performed (separate procedure) Diagnosis Code(s):        --- Professional ---                           K63.89, Other specified diseases of intestine                           R93.3, Abnormal findings on diagnostic imaging of                            other parts of digestive tract CPT copyright 2016 [employer]. All rights reserved. The codes documented in this report are preliminary and upon coder review may  be revised to meet current compliance requirements. Docia Chuck. Henrene Pastor, MD 04/03/2017 1:56:43 PM This report has been signed electronically. Number of Addenda: 0

## 2017-04-03 NOTE — Discharge Instructions (Signed)

## 2017-04-03 NOTE — H&P (View-Only) (Signed)
[facility] Consult: 1:53 PM 04/02/2017  LOS: 2 days    Referring Provider: Dr Donne Hazel  Primary Care Physician:  [name], PA-C Primary Gastroenterologist:  Dr. Sharlett Iles >> Silvano Rusk MD   Reason for Consultation:  Mass in cecum.   HPI: Lori Barber is a 51 y.o. female.  PMH obesity.  IBS.  GERD.  s/p Cholecystectomy and ventral hernia repair 2011. S/p abdominal hysto with LOA 2002, c section, ovarian cystectomy.     05/1999 colonoscopy. Normal study. 12/2003 colonoscopy with polypectomy of hyperplastic polyps 01/2007 Colonoscopy for family history in aunt and first cousin of colorectal cancer: Normal study 12/2008 EGD: esophagitis, duodenitis 09/2011 EGD: gastritis, H Pylori negative.  Stool urease negative 09/2011.   04/2012 Colonoscopy.  Normal study. Suggested repeat study 04/2017.   02/2013 liver biopsy: steatohepatitis with perisinusoidal fibrosis.  No iron deposition.   This past Sunday she felt like her stomach was upset but she was not nauseated. Monday she had no appetite and ate cantaloupe all day. Later in the day she developed abdominal cramps and passed a loose stool and felt a little bit better. However within a few hours she was having pain in her mid to lower abdomen which eventually focused into the right lower quadrant. It became increasingly intense on a scale of 9/10. Other way home from work she tried taking some Gas-X but it did not help. She came to the emergency room on Tuesday morning. 03/31/17 CT scan #1: cecal inflammation at appendiceal base and IC valve.  Unable to exclude appendicitis. Hepatic steatosis.  Aortoiliac calcifications.    04/02/17 CT scan #2: severe hepatic steatosis. Mass like thickening in cecum at base of normal appearing appendix.  Ileocolic node enlarged.   Fat containing umbilical hernia. Aortic atherosclerosis.  WBCs 13.9 >> 7.3.  Hgb 12.3.  MCV 91.  Chemistries including lipase and LFTs normal.    Sxs have improved somewhat with Cipro, Flagyl.  She has tolerated clear liquid trays. She had her first bowel movement in 2 days, after drinking contrast for the CT scan today.  Were formed balls of brown stool. She has not seen any blood in her stool. Patient says that she had not been having anything in the way of GI symptoms until these recent days. She is to have a lot of IBS and GERD symptoms but she quit consuming gluten and her symptoms subsided. However 07/2016 TTG and IgA testing does not support a diagnosis of celiac disease.  Family history of colorectal cancer in aunt and first cousin.  Past Medical History:  Diagnosis Date  . Abdominal pain, epigastric   . Allergy    mold,dust, animal dander  . Anal fissure   . Anemia   . Anxiety   . Esophageal reflux   . Esophageal stricture   . Esophagitis, unspecified   . Family history of malignant neoplasm of gastrointestinal tract   . Fatty liver   . Follicular cyst of ovary   . Hyperlipidemia   . Irritable bowel syndrome   . Obesity, unspecified   .  Personal history of colonic polyps   . PONV (postoperative nausea and vomiting)   . Type II or unspecified type diabetes mellitus without mention of complication, not stated as uncontrolled   . Unspecified gastritis and gastroduodenitis without mention of hemorrhage   . Unspecified hemorrhoids without mention of complication   . Unspecified hypothyroidism     Past Surgical History:  Procedure Laterality Date  . ABDOMINAL HYSTERECTOMY    . CESAREAN SECTION  1999  . CHOLECYSTECTOMY  2011  . COLONOSCOPY    . OVARIAN CYST REMOVAL  2002  . TONSILLECTOMY  1994  . UPPER GASTROINTESTINAL ENDOSCOPY      Prior to Admission medications   Medication Sig Start Date End Date Taking? Authorizing Provider  Multiple Vitamins-Minerals (MULTIVITAMIN  WITH MINERALS) tablet Take 1 tablet by mouth daily.   Yes [provider]  Omega-3 Fatty Acids (FISH OIL) 1200 MG CAPS Take 1,200 mg by mouth 2 (two) times daily.   Yes [provider]  cholecalciferol (VITAMIN D) 1000 units tablet Take 1,000 Units by mouth 2 (two) times daily.    [provider]           Scheduled Meds: . enoxaparin (LOVENOX) injection  40 mg Subcutaneous Q24H  . iopamidol      . iopamidol       Infusions: . ciprofloxacin Stopped (04/02/17 1054)   And  . metronidazole Stopped (04/02/17 0933)  . dextrose 5 % and 0.45 % NaCl with KCl 20 mEq/L 75 mL/hr at 04/01/17 2109   PRN Meds: acetaminophen **OR** acetaminophen, HYDROmorphone (DILAUDID) injection, ondansetron **OR** ondansetron (ZOFRAN) IV, simethicone   Allergies as of 03/31/2017 - Review Complete 03/31/2017  Allergen Reaction Noted  . Cleocin [clindamycin hcl] Anaphylaxis and Shortness Of Breath 04/12/2012  . Penicillins Anaphylaxis and Swelling   . Mushroom extract complex Nausea And Vomiting and Other (See Comments) 10/02/2012  . Amoxicillin Nausea Only   . Clarithromycin Nausea And Vomiting     Family History  Problem Relation Age of Onset  . Colon cancer Maternal Aunt        cousin  . Cancer Maternal Aunt        colon  . Breast cancer Mother   . Cancer Mother        breast  . Other Mother        car accident  . Other Father        liver failure  . Cancer Cousin        colon  . Colon cancer Cousin   . Esophageal cancer Maternal Uncle   . Colon cancer Maternal Uncle   . Heart disease Paternal Grandmother   . Rectal cancer Neg Hx   . Stomach cancer Neg Hx     Social History   Social History  . Marital status: Married    Spouse name: N/A  . Number of children: 1  . Years of education: N/A   Occupational History  . Administrative assistant [employer]   Social History Main Topics  . Smoking status: Current Every Day Smoker    Packs/day: 0.50    Years: 26.00     Types: Cigarettes    Start date: 11/07/1978  . Smokeless tobacco: Never Used  . Alcohol use No     Comment: occasionally  . Drug use: No  . Sexual activity: Yes    Birth control/ protection: None   Other Topics Concern  . Not on file   Social History Narrative   Patient  is married with one daughter. 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 beverages daily, no alcohol   07/10/2016       REVIEW OF SYSTEMS: Constitutional:  Patient generally does not lack energy or experience weakness ENT:  No nose bleeds Pulm:  No shortness of breath, no cough. CV:  No palpitations, no LE edema. No chest pain GU:  No hematuria, no frequency GI:  See HPI Heme:  No unusual bleeding or bruising.   Transfusions:  Does not recall recent transfusions. Neuro:  Morphine, administered here in the hospital, has given her headache.  no peripheral tingling or numbness Derm:  No itching, no rash or sores.  Endocrine:  No sweats or chills.  No polyuria or dysuria Immunization:  Did not inquire as to recent immunizations. Travel:  None beyond local counties in last few months.    PHYSICAL EXAM: Vital signs in last 24 hours: Vitals:   04/01/17 2101 04/02/17 0549  BP: 95/68 (!) 109/59  Pulse: 74 61  Resp: 18 18  Temp: 98.6 F (37 C) 97.7 F (36.5 C)   Wt Readings from Last 3 Encounters:  03/31/17 108.9 kg (240 lb)  07/08/16 112.5 kg (248 lb 2 oz)  09/03/15 111.1 kg (245 lb)    General: Obese, pleasant, comfortable, nontoxic WF Head:  No asymmetry or swelling. No signs of head trauma.  Eyes:  No scleral icterus, no conjunctival pallor. Ears:  Not hard of hearing  Nose:  No congestion or discharge Mouth:  Oral mucosa is moist, pink clear. Excellent dentition. Tongue midline. Neck:  No JVD, no masses, no thyromegaly. Lungs:  Clear to auscultation and percussion bilaterally. No labored breathing or cough. Heart: RRR. No MRG. S1, S2 present. Abdomen:  Obese. Focal tenderness  without guarding or rebound in the right lower quadrant. No masses or organomegaly appreciated. Ventral hernia..   Rectal: Deferred   Musc/Skeltl: No joint erythema, gross deformity or contracture. Extremities:  No CCE.  Neurologic:  Patient is fully alert. Good historian. Oriented times 3. No limb weakness or tremor. Skin:  No telangiectasia, rashes or sores. Tattoos:  None seen Nodes:  No cervical adenopathy.   Psych:  Pleasant, calm, cooperative.  Intake/Output from previous day: 06/27 0701 - 06/28 0700 In: 3570.4 [P.O.:1260; I.V.:1710.4; IV Piggyback:600] Out: 200 [Urine:200] Intake/Output this shift: Total I/O In: 60 [P.O.:60] Out: -   LAB RESULTS:  Recent Labs  03/31/17 0931 04/01/17 0312 04/02/17 0508  WBC 13.9* 11.6* 7.3  HGB 13.7 12.4 12.3  HCT 41.4 39.0 38.9  PLT 300 278 267   BMET Lab Results  Component Value Date   NA 136 04/01/2017   NA 136 03/31/2017   NA 139 09/07/2015   K 3.9 04/01/2017   K 4.5 03/31/2017   K 4.2 09/07/2015   CL 105 04/01/2017   CL 104 03/31/2017   CL 105 09/07/2015   CO2 25 04/01/2017   CO2 27 03/31/2017   CO2 28 09/07/2015   GLUCOSE 111 (H) 04/01/2017   GLUCOSE 108 (H) 03/31/2017   GLUCOSE 106 (H) 09/07/2015   BUN 7 04/01/2017   BUN 9 03/31/2017   BUN 12 09/07/2015   CREATININE 0.83 04/01/2017   CREATININE 0.85 03/31/2017   CREATININE 0.73 09/07/2015   CALCIUM 8.9 04/01/2017   CALCIUM 9.4 03/31/2017   CALCIUM 8.9 09/07/2015   LFT  Recent Labs  03/31/17 0931  PROT 7.7  ALBUMIN 3.7  AST 37  ALT 37  ALKPHOS 80  BILITOT  0.8   PT/INR Lab Results  Component Value Date   INR 1.04 03/04/2013   INR 1.1 (H) 02/04/2013   INR 1.0 ratio 01/23/2009   Hepatitis Panel No results for input(s): HEPBSAG, HCVAB, HEPAIGM, HEPBIGM in the last 72 hours. C-Diff No components found for: CDIFF Lipase     Component Value Date/Time   LIPASE 33 03/31/2017 0931    Drugs of Abuse  No results found for: LABOPIA, COCAINSCRNUR,  LABBENZ, AMPHETMU, THCU, LABBARB   RADIOLOGY STUDIES: Ct Abdomen Pelvis W Contrast  Result Date: 04/02/2017 CLINICAL DATA:  51 year old female with history of right lower quadrant abdominal pain. Evaluate for acute appendicitis. EXAM: CT ABDOMEN AND PELVIS WITH CONTRAST TECHNIQUE: Multidetector CT imaging of the abdomen and pelvis was performed using the standard protocol following bolus administration of intravenous contrast. CONTRAST:  117mL ISOVUE-300 IOPAMIDOL (ISOVUE-300) INJECTION 61% COMPARISON:  CT the abdomen and pelvis 03/31/2017. FINDINGS: Lower chest: Unremarkable. Hepatobiliary: Diffuse low attenuation throughout the hepatic parenchyma, compatible with severe hepatic steatosis. No definite cystic or solid hepatic lesions. No intra or extrahepatic biliary ductal dilatation. Status post cholecystectomy. Pancreas: No pancreatic mass. No pancreatic ductal dilatation. No pancreatic or peripancreatic fluid or inflammatory changes. Spleen: Unremarkable. Adrenals/Urinary Tract: Bilateral kidneys and bilateral adrenal glands are normal in appearance. There is no hydroureteronephrosis. Urinary bladder is normal in appearance. Stomach/Bowel: The appearance of the stomach is normal. There is no pathologic dilatation of small bowel or colon. There is mass-like thickening of the cecum which encompasses the base of the appendix, best appreciated on axial image 70 of series 3. This mass-like area of soft tissue thickening measures up to approximately 2.5 x 4.5 x 3.3 cm. This soft tissue thickening from this mass like area extends into the base of the appendix, however, the appendiceal lumen but is patent and contains oral contrast material. The appendix itself is mildly dilated measuring up to 8-9 mm in diameter, but there are no gross inflammatory changes around the appendix itself. There are inflammatory changes adjacent to the mass-like area of thickening in the cecum. Vascular/Lymphatic: Aortic  atherosclerosis, without evidence of aneurysm or dissection in the abdominal or pelvic vasculature. Mildly enlarged ileocolic lymph node measuring up to 11 mm in short axis (axial image 65 of series 3). No other lymphadenopathy noted in the abdomen or pelvis. Reproductive: Status post hysterectomy. Ovaries are not confidently identified may be surgically absent or atrophic. Other: Moderate to large infraumbilical ventral hernia containing omental fat. No significant volume of ascites. No pneumoperitoneum. Musculoskeletal: There are no aggressive appearing lytic or blastic lesions noted in the visualized portions of the skeleton. IMPRESSION: 1. There is mass-like thickening in the cecum centered around the base of the appendix where there are surrounding inflammatory changes. The appendix itself is mildly enlarged measuring up to 8-9 mm in diameter, but the appendiceal lumen appears to be patent given the internal oral contrast material. Additionally, the appendix itself does not appear overtly inflamed, although there is some appendiceal mural thickening. Overall, the findings are concerning for potential primary colonic neoplasm in the cecum partially occluding the appendiceal lumen. Surgical consultation is recommended. 2. Mildly enlarged 11 mm short axis ileocolic lymph node. 3. Aortic atherosclerosis. 4. Hepatic steatosis. Electronically Signed   By: Vinnie Langton M.D.   On: 04/02/2017 12:29     IMPRESSION:   *  Cecal mass with right lower quadrant pain. Rule out neoplasm. At this point radiology reading and surgical reading as well as opinion is that this is  not appendicitis. Slight improvement, but not resolution, of symptoms with day 3 of Cipro and Flagyl.    *  History hyperplastic colon polyps. Although the chart mentions history of adenomatous polyps, I don't find evidence to support anything other than hyperplastic polyps in 2005.    *  Hepatic steatosis. Underwent liver biopsy  2014    PLAN:     *  Plan colonoscopy. Not sure if we can get this arranged for tomorrow or we will have to defer it to the following day.  Since she's been tolerating clear liquids, and she tolerated oral contrast for CT scan today, I think that she would tolerate the colonoscopy prep.   Azucena Freed  04/02/2017, 1:53 PM Pager: [phone]  GI ATTENDING  History, laboratories, x-rays, prior endoscopy reports reviewed. Patient personally seen and examined. Several friends in her room. Agree with comprehensive consultation note as outlined above. Patient presented with progressively worsening abdominal pain which localized to the right. Abnormalities on imaging in the region of the cecum. Elevated white blood cell count. Not felt to have appendicitis. Still sore but all parameters improving. Question of cecal mass by imaging. Colonoscopy requested. Last colonoscopy 2013 with excellent views of the cecum without abnormality. She has had 4 prior colonoscopies. She could have neoplasia, but the clinical presentation is more consistent with acute ischemia or other infectious/inflammatory process. Regardless, agree with plans for colonoscopy to assess more fully.The nature of the procedure, as well as the risks, benefits, and alternatives were carefully and thoroughly reviewed with the patient. Ample time for discussion and questions allowed. The patient understood, was satisfied, and agreed to proceed.  Docia Chuck. Geri Seminole., M.D. [facility] Division of Gastroenterology

## 2017-04-05 ENCOUNTER — Encounter (HOSPITAL_COMMUNITY): Payer: Self-pay | Admitting: Internal Medicine

## 2017-04-09 ENCOUNTER — Telehealth: Payer: Self-pay | Admitting: Internal Medicine

## 2017-04-09 NOTE — Telephone Encounter (Signed)
Okay with me 

## 2017-04-09 NOTE — Telephone Encounter (Signed)
Dr. Henrene Pastor and Dr. Carlean Purl do you approve of transfer?

## 2017-04-09 NOTE — Telephone Encounter (Signed)
Fine with me

## 2017-04-14 NOTE — Telephone Encounter (Signed)
Left message for patient notifying her of this and for her to call back and schedule appointment.  °

## 2017-04-17 DIAGNOSIS — K37 Unspecified appendicitis: Secondary | ICD-10-CM | POA: Diagnosis not present

## 2017-04-20 ENCOUNTER — Telehealth: Payer: Self-pay | Admitting: Internal Medicine

## 2017-04-20 NOTE — Telephone Encounter (Signed)
Results discussed with pt and path results faxed to pt at [phone] per her request.

## 2017-04-21 ENCOUNTER — Other Ambulatory Visit: Payer: Self-pay | Admitting: General Surgery

## 2017-04-21 DIAGNOSIS — K37 Unspecified appendicitis: Secondary | ICD-10-CM

## 2017-04-23 ENCOUNTER — Ambulatory Visit
Admission: RE | Admit: 2017-04-23 | Discharge: 2017-04-23 | Disposition: A | Discharge status: Home / Self Care | Payer: 59 | Source: Ambulatory Visit | Attending: General Surgery | Admitting: General Surgery

## 2017-04-23 DIAGNOSIS — K429 Umbilical hernia without obstruction or gangrene: Secondary | ICD-10-CM | POA: Diagnosis not present

## 2017-04-23 DIAGNOSIS — K37 Unspecified appendicitis: Secondary | ICD-10-CM

## 2017-04-23 MED ORDER — IOPAMIDOL (ISOVUE-300) INJECTION 61%
100.0000 mL | Freq: Once | INTRAVENOUS | Status: AC | PRN
  Administered 2017-04-23: 125 mL via INTRAVENOUS

## 2017-05-08 DIAGNOSIS — K37 Unspecified appendicitis: Secondary | ICD-10-CM | POA: Diagnosis not present

## 2017-05-08 DIAGNOSIS — M79605 Pain in left leg: Secondary | ICD-10-CM | POA: Diagnosis not present

## 2017-05-08 DIAGNOSIS — M79604 Pain in right leg: Secondary | ICD-10-CM | POA: Diagnosis not present

## 2017-05-13 DIAGNOSIS — M79605 Pain in left leg: Secondary | ICD-10-CM | POA: Diagnosis not present

## 2017-05-13 DIAGNOSIS — M79604 Pain in right leg: Secondary | ICD-10-CM | POA: Diagnosis not present

## 2017-06-16 DIAGNOSIS — G4733 Obstructive sleep apnea (adult) (pediatric): Secondary | ICD-10-CM | POA: Diagnosis not present

## 2017-06-16 DIAGNOSIS — I739 Peripheral vascular disease, unspecified: Secondary | ICD-10-CM | POA: Diagnosis not present

## 2017-06-19 ENCOUNTER — Emergency Department (HOSPITAL_COMMUNITY)
Admission: EM | Admit: 2017-06-19 | Discharge: 2017-06-19 | Disposition: A | Discharge status: Home / Self Care | Payer: 59 | Attending: Emergency Medicine | Admitting: Emergency Medicine

## 2017-06-19 ENCOUNTER — Encounter (HOSPITAL_COMMUNITY): Payer: Self-pay | Admitting: Emergency Medicine

## 2017-06-19 ENCOUNTER — Emergency Department (HOSPITAL_COMMUNITY): Payer: 59

## 2017-06-19 DIAGNOSIS — F17210 Nicotine dependence, cigarettes, uncomplicated: Secondary | ICD-10-CM | POA: Diagnosis not present

## 2017-06-19 DIAGNOSIS — Z79899 Other long term (current) drug therapy: Secondary | ICD-10-CM | POA: Diagnosis not present

## 2017-06-19 DIAGNOSIS — K439 Ventral hernia without obstruction or gangrene: Secondary | ICD-10-CM | POA: Diagnosis not present

## 2017-06-19 DIAGNOSIS — R140 Abdominal distension (gaseous): Secondary | ICD-10-CM | POA: Diagnosis not present

## 2017-06-19 DIAGNOSIS — K589 Irritable bowel syndrome without diarrhea: Secondary | ICD-10-CM | POA: Insufficient documentation

## 2017-06-19 DIAGNOSIS — E039 Hypothyroidism, unspecified: Secondary | ICD-10-CM | POA: Insufficient documentation

## 2017-06-19 DIAGNOSIS — R110 Nausea: Secondary | ICD-10-CM | POA: Diagnosis not present

## 2017-06-19 DIAGNOSIS — E119 Type 2 diabetes mellitus without complications: Secondary | ICD-10-CM | POA: Insufficient documentation

## 2017-06-19 LAB — COMPREHENSIVE METABOLIC PANEL
ALBUMIN: 4.2 g/dL (ref 3.5–5.0)
ALK PHOS: 88 U/L (ref 38–126)
ALT: 55 U/L — ABNORMAL HIGH (ref 14–54)
ANION GAP: 9 (ref 5–15)
AST: 76 U/L — ABNORMAL HIGH (ref 15–41)
BUN: 14 mg/dL (ref 6–20)
CALCIUM: 9.5 mg/dL (ref 8.9–10.3)
CO2: 25 mmol/L (ref 22–32)
Chloride: 104 mmol/L (ref 101–111)
Creatinine, Ser: 0.86 mg/dL (ref 0.44–1.00)
GFR calc non Af Amer: >60 mL/min (ref >60)
GLUCOSE: 96 mg/dL (ref 65–99)
POTASSIUM: 4.1 mmol/L (ref 3.5–5.1)
SODIUM: 138 mmol/L (ref 135–145)
TOTAL PROTEIN: 8.3 g/dL — AB (ref 6.5–8.1)
Total Bilirubin: 0.7 mg/dL (ref 0.3–1.2)

## 2017-06-19 LAB — URINALYSIS, ROUTINE W REFLEX MICROSCOPIC
Bilirubin Urine: NEGATIVE
GLUCOSE, UA: NEGATIVE mg/dL
KETONES UR: NEGATIVE mg/dL
NITRITE: NEGATIVE
PROTEIN: NEGATIVE mg/dL
Specific Gravity, Urine: 1.015 (ref 1.005–1.030)
pH: 6 (ref 5.0–8.0)

## 2017-06-19 LAB — CBC
HEMATOCRIT: 40.9 % (ref 36.0–46.0)
HEMOGLOBIN: 13.9 g/dL (ref 12.0–15.0)
MCH: 30.5 pg (ref 26.0–34.0)
MCHC: 34 g/dL (ref 30.0–36.0)
MCV: 89.9 fL (ref 78.0–100.0)
Platelets: 324 10*3/uL (ref 150–400)
RBC: 4.55 MIL/uL (ref 3.87–5.11)
RDW: 13.4 % (ref 11.5–15.5)
WBC: 9.1 10*3/uL (ref 4.0–10.5)

## 2017-06-19 LAB — LIPASE, BLOOD: Lipase: 34 U/L (ref 11–51)

## 2017-06-19 MED ORDER — SODIUM CHLORIDE 0.9 % IV BOLUS (SEPSIS)
1000.0000 mL | Freq: Once | INTRAVENOUS | Status: AC
  Administered 2017-06-19: 1000 mL via INTRAVENOUS

## 2017-06-19 MED ORDER — ONDANSETRON HCL 8 MG PO TABS: 8.0000 mg | ORAL_TABLET | Freq: Three times a day (TID) | ORAL | 0 refills | Status: DC | PRN

## 2017-06-19 MED ORDER — IOPAMIDOL (ISOVUE-300) INJECTION 61%
INTRAVENOUS | Status: AC
  Administered 2017-06-19: 18:00:00
  Filled 2017-06-19: qty 100

## 2017-06-19 MED ORDER — SODIUM CHLORIDE 0.9 % IV SOLN
INTRAVENOUS | Status: DC
  Administered 2017-06-19: 18:00:00 via INTRAVENOUS

## 2017-06-19 MED ORDER — IOPAMIDOL (ISOVUE-300) INJECTION 61%
100.0000 mL | Freq: Once | INTRAVENOUS | Status: AC | PRN
  Administered 2017-06-19: 100 mL via INTRAVENOUS

## 2017-06-19 NOTE — ED Provider Notes (Signed)
[place] [facility] DEPT Provider Note   CSN: [id number] Arrival date & time: 06/19/17  1359     History   Chief Complaint Chief Complaint  Patient presents with  . Nausea    HPI Lori Barber is a 51 y.o. female.  She presents for evaluation of nausea with bloating sensation and decreased stooling for the last 36 hours.  Last normal bowel movement was 2 days ago when it was soft.  She does not think that she is constipated.  She denies vomiting, blood in stool, fever, chills, weakness or dizziness.  She was evaluated for right lower quadrant abdominal pain, in June 2018, and at that time was suspected to have nonspecific cecal inflammation, and possible appendicitis.  She was monitored in the hospital, treated with antibiotics, and had a second CT, which showed a thickening of the cecum.  Subsequent to that, she had a colonoscopy, with biopsies, which ultimately returned negative.  She had a follow-up CT scan about 1 month later, which did not show inflammation, or bowel blockage.  She is here today after contacting her surgeon's office, who recommended that she come in because she was having nausea and bloating.  She denies recent cough, chest pain, back pain, change in urinary habits or vaginal discharge.  There are no other known modifying factors.  HPI  Past Medical History:  Diagnosis Date  . Abdominal pain, epigastric   . Allergy    mold,dust, animal dander  . Anal fissure   . Anemia   . Anxiety   . Esophageal reflux   . Esophageal stricture   . Esophagitis, unspecified   . Family history of malignant neoplasm of gastrointestinal tract   . Fatty liver   . Follicular cyst of ovary   . Hyperlipidemia   . Irritable bowel syndrome   . Obesity, unspecified   . Personal history of colonic polyps   . PONV (postoperative nausea and vomiting)   . Type II or unspecified type diabetes mellitus without mention of complication, not stated as uncontrolled   . Unspecified gastritis  and gastroduodenitis without mention of hemorrhage   . Unspecified hypothyroidism     Patient Active Problem List   Diagnosis Date Noted  . Abnormal CT of the abdomen   . Inflammation of colonic mucosa   . RLQ abdominal pain 03/31/2017  . Chest pain 07/24/2013  . Diabetes mellitus type 2, diet-controlled ([place]) 07/24/2013  . Palpitations 07/24/2013  . Duodenitis 09/15/2011  . Gastritis 09/15/2011  . Nausea alone 09/15/2011  . GERD with stricture 09/15/2011  . Nausea 09/04/2011  . Diarrhea following gastrointestinal surgery 09/04/2011  . Hernia of unspecified site of abdominal cavity without mention of obstruction or gangrene 09/04/2011  . Obesity 09/04/2011  . S/P cholecystectomy 09/04/2011  . History of gastroesophageal reflux (GERD) 09/04/2011  . Ventral hernia 07/11/2011  . IBS (irritable bowel syndrome) 07/11/2011  . GERD (gastroesophageal reflux disease) 07/11/2011  . Fatty infiltration of liver 07/11/2011  . Bilateral ovarian cysts 07/11/2011  . Hx of cholecystectomy 07/11/2011  . ABDOMINAL WALL HERNIA 10/12/2009  . ARTHRALGIA 03/06/2009  . ESOPHAGITIS 01/22/2009  . GASTRITIS 12/08/2008  . OBESITY, UNSPECIFIED 12/07/2008  . ABDOMINAL PAIN-EPIGASTRIC 12/07/2008  . HYPOTHYROIDISM 11/09/2008  . HEMORRHOIDS 11/09/2008  . GERD 11/09/2008  . IBS 11/09/2008  . FATTY LIVER DISEASE 11/09/2008  . OVARIAN CYST 11/09/2008  . COLONIC POLYPS, ADENOMATOUS, HX OF 11/09/2008    Past Surgical History:  Procedure Laterality Date  . ABDOMINAL HYSTERECTOMY    .  CESAREAN SECTION  1999  . CHOLECYSTECTOMY  2011  . COLONOSCOPY    . COLONOSCOPY N/A 04/03/2017   Procedure: COLONOSCOPY;  Surgeon: Irene Shipper, MD;  Location: [facility] ENDOSCOPY;  Service: Endoscopy;  Laterality: N/A;  . OVARIAN CYST REMOVAL  2002  . TONSILLECTOMY  1994  . UPPER GASTROINTESTINAL ENDOSCOPY      OB History    No data available       Home Medications    Prior to Admission medications   Medication Sig  Start Date End Date Taking? Authorizing Provider  Multiple Vitamins-Minerals (MULTIVITAMIN WITH MINERALS) tablet Take 1 tablet by mouth daily.    [provider]  Omega-3 Fatty Acids (FISH OIL) 1200 MG CAPS Take 1,200 mg by mouth 2 (two) times daily.    [provider]    Family History Family History  Problem Relation Age of Onset  . Colon cancer Maternal Aunt        cousin  . Cancer Maternal Aunt        colon  . Breast cancer Mother   . Cancer Mother        breast  . Other Mother        car accident  . Other Father        liver failure  . Cancer Cousin        colon  . Colon cancer Cousin   . Esophageal cancer Maternal Uncle   . Colon cancer Maternal Uncle   . Heart disease Paternal Grandmother   . Rectal cancer Neg Hx   . Stomach cancer Neg Hx     Social History Social History  Substance Use Topics  . Smoking status: Current Every Day Smoker    Packs/day: 0.50    Years: 26.00    Types: Cigarettes    Start date: 11/07/1978  . Smokeless tobacco: Never Used  . Alcohol use No     Comment: occasionally     Allergies   Cleocin [clindamycin hcl]; Penicillins; Mushroom extract complex; Amoxicillin; and Clarithromycin   Review of Systems Review of Systems  All other systems reviewed and are negative.    Physical Exam Updated Vital Signs BP 121/84 (BP Location: Right Arm)   Pulse 79   Temp 98 F (36.7 C) (Oral)   Resp 17   SpO2 97%   Physical Exam  Constitutional: She is oriented to person, place, and time. She appears well-developed and well-nourished. No distress.  HENT:  Head: Normocephalic and atraumatic.  Eyes: Pupils are equal, round, and reactive to light. Conjunctivae and EOM are normal.  Neck: Normal range of motion and phonation normal. Neck supple.  Cardiovascular: Normal rate and regular rhythm.   Pulmonary/Chest: Effort normal and breath sounds normal. She exhibits no tenderness.  Abdominal: Soft. She exhibits no distension.  There is no tenderness. There is no guarding.  Musculoskeletal: Normal range of motion.  Neurological: She is alert and oriented to person, place, and time. She exhibits normal muscle tone.  Skin: Skin is warm and dry.  Psychiatric: She has a normal mood and affect. Her behavior is normal. Judgment and thought content normal.  Nursing note and vitals reviewed.    ED Treatments / Results  Labs (all labs ordered are listed, but only abnormal results are displayed) Labs Reviewed  COMPREHENSIVE METABOLIC PANEL - Abnormal; Notable for the following:       Result Value   Total Protein 8.3 (*)    AST 76 (*)    ALT  55 (*)    All other components within normal limits  URINALYSIS, ROUTINE W REFLEX MICROSCOPIC - Abnormal; Notable for the following:    APPearance HAZY (*)    Hgb urine dipstick SMALL (*)    Leukocytes, UA SMALL (*)    Bacteria, UA FEW (*)    Squamous Epithelial / LPF 0-5 (*)    All other components within normal limits  LIPASE, BLOOD  CBC    EKG  EKG Interpretation None       Radiology No results found.  Procedures Procedures (including critical care time)  Medications Ordered in ED Medications  sodium chloride 0.9 % bolus 1,000 mL (not administered)  0.9 %  sodium chloride infusion (not administered)     Initial Impression / Assessment and Plan / ED Course  I have reviewed the triage vital signs and the nursing notes.  Pertinent labs & imaging results that were available during my care of the patient were reviewed by me and considered in my medical decision making (see chart for details).     Patient Vitals for the past 24 hrs:  BP Temp Temp src Pulse Resp SpO2  06/19/17 1437 121/84 98 F (36.7 C) Oral 79 17 97 %    6:21 PM Reevaluation with update and discussion. After initial assessment and treatment, an updated evaluation reveals she remains comfortable, findings discussed with the patient and all questions answered. Kaylynn Chamblin L     Final  Clinical Impressions(s) / ED Diagnoses   Final diagnoses:  Nausea  Abdominal bloating    Nonspecific abdominal discomfort, with recent proximal colon abnormality, and possible appendicitis, which was treated with oral antibiotics.  Today CT scan is reassuring.  Doubt serious bacterial infection, metabolic instability or impending vascular collapse.  Nursing Notes Reviewed/ Care Coordinated Applicable Imaging Reviewed Interpretation of Laboratory Data incorporated into ED treatment  The patient appears reasonably screened and/or stabilized for discharge and I doubt any other medical condition or other [facility] requiring further screening, evaluation, or treatment in the ED at this time prior to discharge.  Plan: Home Medications-continue usual medication; Home Treatments-rest, gradually advance diet; return here if the recommended treatment, does not improve the symptoms; Recommended follow up-PCP, as needed   New Prescriptions New Prescriptions   No medications on file     Daleen Bo, MD 06/19/17 [phone]

## 2017-06-19 NOTE — ED Triage Notes (Signed)
Pt reports she has been having nausea and bloating for the past day. Denies abd pain or diarrhea. Called PCP, who told her to come here for eval.

## 2017-06-19 NOTE — Discharge Instructions (Signed)
The testing today was reassuring.  CT scan did not indicate any problems with your colon or appendix.  There are very minimal elevations of 2 of your liver enzymes.  The others however are not elevated, making this abnormality probably not significant.  We recommend that you gradually advance your food intake, beginning with clear liquids, then bland foods, then regular foods over the next 2 or 3 days time.  Take Tylenol or Motrin for pain or fever.  Return here, or see your doctor as needed for problems.

## 2017-06-23 ENCOUNTER — Encounter (INDEPENDENT_AMBULATORY_CARE_PROVIDER_SITE_OTHER): Payer: Self-pay | Admitting: Vascular Surgery

## 2017-06-23 ENCOUNTER — Ambulatory Visit (INDEPENDENT_AMBULATORY_CARE_PROVIDER_SITE_OTHER): Payer: 59 | Admitting: Vascular Surgery

## 2017-06-23 VITALS — BP 130/82 | HR 63 | Resp 17 | Ht 65.0 in | Wt 242.0 lb

## 2017-06-23 DIAGNOSIS — E119 Type 2 diabetes mellitus without complications: Secondary | ICD-10-CM

## 2017-06-23 DIAGNOSIS — I70213 Atherosclerosis of native arteries of extremities with intermittent claudication, bilateral legs: Secondary | ICD-10-CM | POA: Diagnosis not present

## 2017-06-23 DIAGNOSIS — E785 Hyperlipidemia, unspecified: Secondary | ICD-10-CM | POA: Diagnosis not present

## 2017-06-23 NOTE — Patient Instructions (Signed)

## 2017-06-23 NOTE — Assessment & Plan Note (Signed)
blood glucose control important in reducing the progression of atherosclerotic disease. Also, involved in wound healing. On appropriate medications.  

## 2017-06-23 NOTE — Assessment & Plan Note (Signed)
lipid control important in reducing the progression of atherosclerotic disease. Continue statin therapy  

## 2017-06-23 NOTE — Progress Notes (Signed)
Patient ID: Lori Barber, female   DOB: [birth year], 51 y.o.   MRN: [id number]  Chief Complaint  Patient presents with  . New Patient (Initial Visit)    Claudication    HPI Lori Barber is a 51 y.o. female.  I am asked to see the patient by Dr. Rudean Haskell for evaluation of claudication.    The patient is seen for evaluation of painful lower extremities and diminished pulses. Patient notes the pain is always associated with activity and is very consistent day today. Typically, the pain occurs at less than one to two blocks, progress is as activity continues to the point that the patient must stop walking. Resting including standing still for several minutes allowed resumption of the activity and the ability to walk a similar distance before stopping again. Uneven terrain and inclined shorten the distance. The pain has been progressive over the past several years. The patient states the inability to walk is now having a profound negative impact on quality of life and daily activities.  The patient denies rest pain or dangling of an extremity off the side of the bed during the night for relief. No open wounds or sores at this time. No prior interventions or surgeries.  No history of back problems or DJD of the lumbar sacral spine.   The patient denies changes in claudication symptoms or new rest pain symptoms.  No new ulcers or wounds of the foot Although she does describe symptoms that may have been blue toe syndrome several months ago..  The patient's blood pressure has been stable and relatively well controlled. The patient denies amaurosis fugax or recent TIA symptoms. There are no recent neurological changes noted. The patient denies history of DVT, PE or superficial thrombophlebitis. The patient denies recent episodes of angina or shortness of breath.    Past Medical History:  Diagnosis Date  . Abdominal pain, epigastric   . Allergy    mold,dust, animal dander  . Anal  fissure   . Anemia   . Anxiety   . Esophageal reflux   . Esophageal stricture   . Esophagitis, unspecified   . Family history of malignant neoplasm of gastrointestinal tract   . Fatty liver   . Follicular cyst of ovary   . Hyperlipidemia   . Irritable bowel syndrome   . Obesity, unspecified   . Personal history of colonic polyps   . PONV (postoperative nausea and vomiting)   . Type II or unspecified type diabetes mellitus without mention of complication, not stated as uncontrolled   . Unspecified gastritis and gastroduodenitis without mention of hemorrhage   . Unspecified hypothyroidism     Past Surgical History:  Procedure Laterality Date  . ABDOMINAL HYSTERECTOMY    . CESAREAN SECTION  1999  . CHOLECYSTECTOMY  2011  . COLONOSCOPY    . COLONOSCOPY N/A 04/03/2017   Procedure: COLONOSCOPY;  Surgeon: Irene Shipper, MD;  Location: [facility] ENDOSCOPY;  Service: Endoscopy;  Laterality: N/A;  . OVARIAN CYST REMOVAL  2002  . TONSILLECTOMY  1994  . UPPER GASTROINTESTINAL ENDOSCOPY      Family History  Problem Relation Age of Onset  . Colon cancer Maternal Aunt        cousin  . Cancer Maternal Aunt        colon  . Breast cancer Mother   . Cancer Mother        breast  . Other Mother        car accident  .  Other Father        liver failure  . Cancer Cousin        colon  . Colon cancer Cousin   . Esophageal cancer Maternal Uncle   . Colon cancer Maternal Uncle   . Heart disease Paternal Grandmother   . Rectal cancer Neg Hx   . Stomach cancer Neg Hx     Social History No IVDU. Says she quit smoking last month NO ETOH No smokeless tobacco  Allergies  Allergen Reactions  . Cleocin [Clindamycin Hcl] Anaphylaxis and Shortness Of Breath    Blood pressure dropping  . Penicillins Anaphylaxis and Swelling    Has patient had a PCN reaction causing immediate rash, facial/tongue/throat swelling, SOB or lightheadedness with hypotension: Yes Has patient had a PCN reaction causing  severe rash involving mucus membranes or skin necrosis:no Has patient had a PCN reaction that required hospitalization: yes Has patient had a PCN reaction occurring within the last 10 years:no If all of the above answers are "NO", then may proceed with Cephalosporin use.   . Mushroom Extract Complex Nausea And Vomiting and Other (See Comments)    Headache   . Amoxicillin Nausea Only  . Clarithromycin Nausea And Vomiting    Current Outpatient Prescriptions  Medication Sig Dispense Refill  . aspirin EC 81 MG tablet Take 81 mg by mouth daily.    . sertraline (ZOLOFT) 50 MG tablet Take 50 mg by mouth at bedtime.    . simvastatin (ZOCOR) 20 MG tablet Take 20 mg by mouth daily.    . meloxicam (MOBIC) 7.5 MG tablet     . Multiple Vitamins-Minerals (MULTIVITAMIN WITH MINERALS) tablet Take 1 tablet by mouth daily.    . Omega-3 Fatty Acids (FISH OIL) 1200 MG CAPS Take 1,200 mg by mouth 2 (two) times daily.    . ondansetron (ZOFRAN) 8 MG tablet Take 1 tablet (8 mg total) by mouth every 8 (eight) hours as needed for nausea or vomiting. (Patient not taking: Reported on 06/23/2017) 20 tablet 0   No current facility-administered medications for this visit.       REVIEW OF SYSTEMS (Negative unless checked)  Constitutional: '[]' Weight loss  '[]' Fever  '[]' Chills Cardiac: '[]' Chest pain   '[]' Chest pressure   '[]' Palpitations   '[]' Shortness of breath when laying flat   '[]' Shortness of breath at rest   '[x]' Shortness of breath with exertion. Vascular:  '[]' Pain in legs with walking   '[]' Pain in legs at rest   '[]' Pain in legs when laying flat   '[x]' Claudication   '[]' Pain in feet when walking  '[]' Pain in feet at rest  '[]' Pain in feet when laying flat   '[]' History of DVT   '[]' Phlebitis   '[x]' Swelling in legs   '[]' Varicose veins   '[]' Non-healing ulcers Pulmonary:   '[]' Uses home oxygen   '[]' Productive cough   '[]' Hemoptysis   '[]' Wheeze  '[]' COPD   '[]' Asthma Neurologic:  '[]' Dizziness  '[]' Blackouts   '[]' Seizures   '[]' History of stroke   '[]' History of  TIA  '[]' Aphasia   '[]' Temporary blindness   '[]' Dysphagia   '[]' Weakness or numbness in arms   '[]' Weakness or numbness in legs Musculoskeletal:  '[x]' Arthritis   '[]' Joint swelling   '[]' Joint pain   '[]' Low back pain Hematologic:  '[]' Easy bruising  '[]' Easy bleeding   '[]' Hypercoagulable state   '[]' Anemic  '[]' Hepatitis Gastrointestinal:  '[]' Blood in stool   '[]' Vomiting blood  '[x]' Gastroesophageal reflux/heartburn   '[x]' Abdominal pain Genitourinary:  '[]' Chronic kidney disease   '[]' Difficult urination  '[]' Frequent urination  '[]' Burning  with urination   '[]' Hematuria Skin:  '[]' Rashes   '[]' Ulcers   '[]' Wounds Psychological:  '[]' History of anxiety   '[]'  History of major depression.    Physical Exam BP 130/82 (BP Location: Right Arm)   Pulse 63   Resp 17   Ht '5\' 5"'  (1.651 m)   Wt 242 lb (109.8 kg)   BMI 40.27 kg/m  Gen:  WD/WN, NAD Head: [place]/AT, No temporalis wasting.  Ear/Nose/Throat: Hearing grossly intact, nares w/o erythema or drainage, oropharynx w/o Erythema/Exudate Eyes: Conjunctiva clear, sclera non-icteric  Neck: trachea midline.  No JVD.  Pulmonary:  Good air movement, respirations not labored, no use of accessory muscles Cardiac: RRR, No JVD Vascular:  Vessel Right Left  Radial Palpable Palpable                          PT Trace Palpable 1+ Palpable  DP 1+ Palpable 1+ Palpable   Gastrointestinal: soft, non-tender/non-distended.  Musculoskeletal: M/S 5/5 throughout.  Extremities without ischemic changes.  No deformity or atrophy. Trace LE edema. Neurologic: Sensation grossly intact in extremities.  Symmetrical.  Speech is fluent. Motor exam as listed above. Psychiatric: Judgment intact, Mood & affect appropriate for pt's clinical situation. Dermatologic: No rashes or ulcers noted.  No cellulitis or open wounds.  Radiology Ct Abdomen Pelvis W Contrast  Result Date: 06/19/2017 CLINICAL DATA:  Nausea and bloating over the last day. History of appendicitis treated conservatively an June and July. EXAM: CT  ABDOMEN AND PELVIS WITH CONTRAST TECHNIQUE: Multidetector CT imaging of the abdomen and pelvis was performed using the standard protocol following bolus administration of intravenous contrast. CONTRAST:  130m ISOVUE-300 IOPAMIDOL (ISOVUE-300) INJECTION 61% COMPARISON:  04/23/2017.  04/02/2017. FINDINGS: Lower chest: Normal Hepatobiliary: Mild fatty change of the liver. No focal lesion. Previous cholecystectomy. Pancreas: Normal Spleen: Normal Adrenals/Urinary Tract: Adrenal glands are normal. Kidneys are normal. Bladder is normal. Stomach/Bowel: No evidence of ileus or obstruction. Appendix appears normal without evidence of inflammation. Ventral hernia inferior to the umbilicus with the defect measuring 4 cm. Hernia contains only fat. No herniated bowel. Mild diverticulosis without evidence of diverticulitis. Vascular/Lymphatic: Aorta appears normal. IVC is normal. Mild iliac atherosclerosis. Reproductive: Previous hysterectomy.  No pelvic mass. Other: No free fluid or air. Musculoskeletal: Negative IMPRESSION: No acute finding to explain the clinical presentation. No evidence of ileus, obstruction or inflammatory disease. Specifically, the right lower quadrant and appendix region appear normal. Mild fatty change of the liver again demonstrated. Infraumbilical ventral hernia containing only fat, unchanged. Electronically Signed   By: MNelson ChimesM.D.   On: 06/19/2017 18:13    Labs Recent Results (from the past 2160 hour(s))  Lipase, blood     Status: None   Collection Time: 03/31/17  9:31 AM  Result Value Ref Range   Lipase 33 11 - 51 U/L  Comprehensive metabolic panel     Status: Abnormal   Collection Time: 03/31/17  9:31 AM  Result Value Ref Range   Sodium 136 135 - 145 mmol/L   Potassium 4.5 3.5 - 5.1 mmol/L   Chloride 104 101 - 111 mmol/L   CO2 27 22 - 32 mmol/L   Glucose, Bld 108 (H) 65 - 99 mg/dL   BUN 9 6 - 20 mg/dL   Creatinine, Ser 0.85 0.44 - 1.00 mg/dL   Calcium 9.4 8.9 - 10.3 mg/dL     Total Protein 7.7 6.5 - 8.1 g/dL   Albumin 3.7 3.5 - 5.0 g/dL  AST 37 15 - 41 U/L   ALT 37 14 - 54 U/L   Alkaline Phosphatase 80 38 - 126 U/L   Total Bilirubin 0.8 0.3 - 1.2 mg/dL   GFR calc non Af Amer >60 >60 mL/min   GFR calc Af Amer >60 >60 mL/min    Comment: (NOTE) The eGFR has been calculated using the CKD EPI equation. This calculation has not been validated in all clinical situations. eGFR's persistently <60 mL/min signify possible Chronic Kidney Disease.    Anion gap 5 5 - 15  CBC     Status: Abnormal   Collection Time: 03/31/17  9:31 AM  Result Value Ref Range   WBC 13.9 (H) 4.0 - 10.5 K/uL   RBC 4.52 3.87 - 5.11 MIL/uL   Hemoglobin 13.7 12.0 - 15.0 g/dL   HCT 41.4 36.0 - 46.0 %   MCV 91.6 78.0 - 100.0 fL   MCH 30.3 26.0 - 34.0 pg   MCHC 33.1 30.0 - 36.0 g/dL   RDW 13.4 11.5 - 15.5 %   Platelets 300 150 - 400 K/uL  Urinalysis, Routine w reflex microscopic     Status: Abnormal   Collection Time: 03/31/17 11:18 AM  Result Value Ref Range   Color, Urine YELLOW YELLOW   APPearance HAZY (A) CLEAR   Specific Gravity, Urine 1.020 1.005 - 1.030   pH 5.0 5.0 - 8.0   Glucose, UA NEGATIVE NEGATIVE mg/dL   Hgb urine dipstick SMALL (A) NEGATIVE   Bilirubin Urine NEGATIVE NEGATIVE   Ketones, ur NEGATIVE NEGATIVE mg/dL   Protein, ur NEGATIVE NEGATIVE mg/dL   Nitrite NEGATIVE NEGATIVE   Leukocytes, UA TRACE (A) NEGATIVE   RBC / HPF 0-5 0 - 5 RBC/hpf   WBC, UA 0-5 0 - 5 WBC/hpf   Bacteria, UA FEW (A) NONE SEEN   Squamous Epithelial / LPF 6-30 (A) NONE SEEN   Mucus PRESENT   Basic metabolic panel     Status: Abnormal   Collection Time: 04/01/17  3:12 AM  Result Value Ref Range   Sodium 136 135 - 145 mmol/L   Potassium 3.9 3.5 - 5.1 mmol/L   Chloride 105 101 - 111 mmol/L   CO2 25 22 - 32 mmol/L   Glucose, Bld 111 (H) 65 - 99 mg/dL   BUN 7 6 - 20 mg/dL   Creatinine, Ser 0.83 0.44 - 1.00 mg/dL   Calcium 8.9 8.9 - 10.3 mg/dL   GFR calc non Af Amer >60 >60 mL/min    GFR calc Af Amer >60 >60 mL/min    Comment: (NOTE) The eGFR has been calculated using the CKD EPI equation. This calculation has not been validated in all clinical situations. eGFR's persistently <60 mL/min signify possible Chronic Kidney Disease.    Anion gap 6 5 - 15  CBC     Status: Abnormal   Collection Time: 04/01/17  3:12 AM  Result Value Ref Range   WBC 11.6 (H) 4.0 - 10.5 K/uL   RBC 4.20 3.87 - 5.11 MIL/uL   Hemoglobin 12.4 12.0 - 15.0 g/dL   HCT 39.0 36.0 - 46.0 %   MCV 92.9 78.0 - 100.0 fL   MCH 29.5 26.0 - 34.0 pg   MCHC 31.8 30.0 - 36.0 g/dL   RDW 13.6 11.5 - 15.5 %   Platelets 278 150 - 400 K/uL  CBC     Status: None   Collection Time: 04/02/17  5:08 AM  Result Value Ref Range   WBC  7.3 4.0 - 10.5 K/uL   RBC 4.24 3.87 - 5.11 MIL/uL   Hemoglobin 12.3 12.0 - 15.0 g/dL   HCT 38.9 36.0 - 46.0 %   MCV 91.7 78.0 - 100.0 fL   MCH 29.0 26.0 - 34.0 pg   MCHC 31.6 30.0 - 36.0 g/dL   RDW 13.4 11.5 - 15.5 %   Platelets 267 150 - 400 K/uL  Urinalysis, Routine w reflex microscopic     Status: Abnormal   Collection Time: 06/19/17  2:41 PM  Result Value Ref Range   Color, Urine YELLOW YELLOW   APPearance HAZY (A) CLEAR   Specific Gravity, Urine 1.015 1.005 - 1.030   pH 6.0 5.0 - 8.0   Glucose, UA NEGATIVE NEGATIVE mg/dL   Hgb urine dipstick SMALL (A) NEGATIVE   Bilirubin Urine NEGATIVE NEGATIVE   Ketones, ur NEGATIVE NEGATIVE mg/dL   Protein, ur NEGATIVE NEGATIVE mg/dL   Nitrite NEGATIVE NEGATIVE   Leukocytes, UA SMALL (A) NEGATIVE   RBC / HPF 0-5 0 - 5 RBC/hpf   WBC, UA 0-5 0 - 5 WBC/hpf   Bacteria, UA FEW (A) NONE SEEN   Squamous Epithelial / LPF 0-5 (A) NONE SEEN   Mucus PRESENT   Lipase, blood     Status: None   Collection Time: 06/19/17  2:46 PM  Result Value Ref Range   Lipase 34 11 - 51 U/L  Comprehensive metabolic panel     Status: Abnormal   Collection Time: 06/19/17  2:46 PM  Result Value Ref Range   Sodium 138 135 - 145 mmol/L   Potassium 4.1 3.5 -  5.1 mmol/L   Chloride 104 101 - 111 mmol/L   CO2 25 22 - 32 mmol/L   Glucose, Bld 96 65 - 99 mg/dL   BUN 14 6 - 20 mg/dL   Creatinine, Ser 0.86 0.44 - 1.00 mg/dL   Calcium 9.5 8.9 - 10.3 mg/dL   Total Protein 8.3 (H) 6.5 - 8.1 g/dL   Albumin 4.2 3.5 - 5.0 g/dL   AST 76 (H) 15 - 41 U/L   ALT 55 (H) 14 - 54 U/L   Alkaline Phosphatase 88 38 - 126 U/L   Total Bilirubin 0.7 0.3 - 1.2 mg/dL   GFR calc non Af Amer >60 >60 mL/min   GFR calc Af Amer >60 >60 mL/min    Comment: (NOTE) The eGFR has been calculated using the CKD EPI equation. This calculation has not been validated in all clinical situations. eGFR's persistently <60 mL/min signify possible Chronic Kidney Disease.    Anion gap 9 5 - 15  CBC     Status: None   Collection Time: 06/19/17  2:46 PM  Result Value Ref Range   WBC 9.1 4.0 - 10.5 K/uL   RBC 4.55 3.87 - 5.11 MIL/uL   Hemoglobin 13.9 12.0 - 15.0 g/dL   HCT 40.9 36.0 - 46.0 %   MCV 89.9 78.0 - 100.0 fL   MCH 30.5 26.0 - 34.0 pg   MCHC 34.0 30.0 - 36.0 g/dL   RDW 13.4 11.5 - 15.5 %   Platelets 324 150 - 400 K/uL    Assessment/Plan:  Diabetes mellitus type 2, diet-controlled blood glucose control important in reducing the progression of atherosclerotic disease. Also, involved in wound healing. On appropriate medications.   Hyperlipidemia lipid control important in reducing the progression of atherosclerotic disease. Continue statin therapy   Atherosclerosis of native arteries of extremity with intermittent claudication [facility]) The patient has  symptoms consistent with atherosclerosis with claudication. She apparently had noninvasive studies recently which confirmed this, but I do not have those for review today. She does not have any limb threatening symptoms. She was recently started on a statin agent and has recently stopped smoking. She was advised to begin a regular exercise regimen and plans to do that in the near future. At this point, I would recommend she  continue her plan for a scheduled exercise regimen and continue the lifestyle modifications as mentioned above. I will see her back in about 3-4 months with arterial duplex and ABIs for follow-up. I discussed the non-limb threatening nature of her situation. I discussed the pathophysiology and natural history of peripheral arterial patient voices her understanding and is agreeable to proceed with our plan of care.      Leotis Pain 06/23/2017, 12:56 PM   This note was created with Dragon medical transcription system.  Any errors from dictation are unintentional.

## 2017-06-23 NOTE — Assessment & Plan Note (Signed)
The patient has symptoms consistent with atherosclerosis with claudication. She apparently had noninvasive studies recently which confirmed this, but I do not have those for review today. She does not have any limb threatening symptoms. She was recently started on a statin agent and has recently stopped smoking. She was advised to begin a regular exercise regimen and plans to do that in the near future. At this point, I would recommend she continue her plan for a scheduled exercise regimen and continue the lifestyle modifications as mentioned above. I will see her back in about 3-4 months with arterial duplex and ABIs for follow-up. I discussed the non-limb threatening nature of her situation. I discussed the pathophysiology and natural history of peripheral arterial patient voices her understanding and is agreeable to proceed with our plan of care.

## 2017-07-13 DIAGNOSIS — G4733 Obstructive sleep apnea (adult) (pediatric): Secondary | ICD-10-CM | POA: Diagnosis not present

## 2017-07-15 ENCOUNTER — Encounter (INDEPENDENT_AMBULATORY_CARE_PROVIDER_SITE_OTHER): Payer: Self-pay

## 2017-07-15 DIAGNOSIS — G4733 Obstructive sleep apnea (adult) (pediatric): Secondary | ICD-10-CM | POA: Diagnosis not present

## 2017-08-04 DIAGNOSIS — G4733 Obstructive sleep apnea (adult) (pediatric): Secondary | ICD-10-CM | POA: Diagnosis not present

## 2017-09-04 DIAGNOSIS — G4733 Obstructive sleep apnea (adult) (pediatric): Secondary | ICD-10-CM | POA: Diagnosis not present

## 2017-09-23 ENCOUNTER — Encounter (INDEPENDENT_AMBULATORY_CARE_PROVIDER_SITE_OTHER): Payer: 59

## 2017-09-23 ENCOUNTER — Ambulatory Visit (INDEPENDENT_AMBULATORY_CARE_PROVIDER_SITE_OTHER): Payer: 59 | Admitting: Vascular Surgery

## 2017-10-04 DIAGNOSIS — G4733 Obstructive sleep apnea (adult) (pediatric): Secondary | ICD-10-CM | POA: Diagnosis not present

## 2017-10-07 DIAGNOSIS — I739 Peripheral vascular disease, unspecified: Secondary | ICD-10-CM | POA: Diagnosis not present

## 2017-10-14 DIAGNOSIS — E782 Mixed hyperlipidemia: Secondary | ICD-10-CM | POA: Diagnosis not present

## 2017-10-14 DIAGNOSIS — G4733 Obstructive sleep apnea (adult) (pediatric): Secondary | ICD-10-CM | POA: Insufficient documentation

## 2017-10-14 DIAGNOSIS — I739 Peripheral vascular disease, unspecified: Secondary | ICD-10-CM | POA: Diagnosis not present

## 2017-10-15 DIAGNOSIS — M25562 Pain in left knee: Secondary | ICD-10-CM | POA: Diagnosis not present

## 2017-10-15 DIAGNOSIS — M25561 Pain in right knee: Secondary | ICD-10-CM | POA: Diagnosis not present

## 2017-10-28 ENCOUNTER — Encounter (INDEPENDENT_AMBULATORY_CARE_PROVIDER_SITE_OTHER): Payer: Self-pay | Admitting: Vascular Surgery

## 2017-10-28 ENCOUNTER — Ambulatory Visit (INDEPENDENT_AMBULATORY_CARE_PROVIDER_SITE_OTHER): Payer: 59

## 2017-10-28 ENCOUNTER — Encounter (INDEPENDENT_AMBULATORY_CARE_PROVIDER_SITE_OTHER): Payer: Self-pay

## 2017-10-28 ENCOUNTER — Ambulatory Visit (INDEPENDENT_AMBULATORY_CARE_PROVIDER_SITE_OTHER): Payer: 59 | Admitting: Vascular Surgery

## 2017-10-28 VITALS — BP 110/77 | HR 91 | Resp 18 | Ht 65.0 in | Wt 251.6 lb

## 2017-10-28 DIAGNOSIS — E785 Hyperlipidemia, unspecified: Secondary | ICD-10-CM

## 2017-10-28 DIAGNOSIS — E119 Type 2 diabetes mellitus without complications: Secondary | ICD-10-CM

## 2017-10-28 DIAGNOSIS — I70213 Atherosclerosis of native arteries of extremities with intermittent claudication, bilateral legs: Secondary | ICD-10-CM | POA: Diagnosis not present

## 2017-10-28 DIAGNOSIS — I739 Peripheral vascular disease, unspecified: Secondary | ICD-10-CM

## 2017-10-28 NOTE — Progress Notes (Signed)
Subjective:    Patient ID: Lori Barber, female    DOB: 10-17-1965, 52 y.o.   MRN: [id number] Chief Complaint  Patient presents with  . Follow-up    pt conv abi,bil art   The patient was last seen on June 23, 2017 for evaluation of claudication.  The patient underwent an ABI at [facility] clinic which was essentially hhowever was still referred to [place] to rule out any contributing peripheral artery disease.  The patient does endorse a history of "back problems".  Patient does note that she has been diagnosed in the past with a "disc issue".  The patient underwent a bilateral ABI which was notable for no significant lower extremity arterial disease.  The patient was noted to have triphasic tibials bilaterally.  The bilateral toe brachial indices were normal.  The patient denies any rest pain or ulceration to the lower extremity.  The patient denies any fever, nausea or vomiting.   Review of Systems  Constitutional: Negative.   HENT: Negative.   Eyes: Negative.   Respiratory: Negative.   Cardiovascular:       Claudication  Gastrointestinal: Negative.   Endocrine: Negative.   Genitourinary: Negative.   Musculoskeletal: Negative.   Skin: Negative.   Allergic/Immunologic: Negative.   Neurological: Negative.   Hematological: Negative.   Psychiatric/Behavioral: Negative.       Objective:   Physical Exam  Constitutional: She is oriented to person, place, and time. She appears well-developed and well-nourished. No distress.  HENT:  Head: Normocephalic and atraumatic.  Eyes: Conjunctivae are normal. Pupils are equal, round, and reactive to light.  Neck: Normal range of motion.  Cardiovascular: Normal rate, regular rhythm, normal heart sounds and intact distal pulses.  Pulses:      Radial pulses are 2+ on the right side, and 2+ on the left side.       Dorsalis pedis pulses are 1+ on the right side, and 1+ on the left side.       Posterior tibial pulses are 1+ on the right side, and  1+ on the left side.  Pulmonary/Chest: Effort normal and breath sounds normal.  Musculoskeletal: Normal range of motion. She exhibits no edema.  Neurological: She is alert and oriented to person, place, and time.  Skin: Skin is warm and dry. She is not diaphoretic.  Psychiatric: She has a normal mood and affect. Her behavior is normal. Judgment and thought content normal.  Vitals reviewed.  BP 110/77 (BP Location: Right Arm)   Pulse 91   Resp 18   Ht 5\' 5"  (1.651 m)   Wt 251 lb 9.6 oz (114.1 kg)   BMI 41.87 kg/m   Past Medical History:  Diagnosis Date  . Abdominal pain, epigastric   . Allergy    mold,dust, animal dander  . Anal fissure   . Anemia   . Anxiety   . Esophageal reflux   . Esophageal stricture   . Esophagitis, unspecified   . Family history of malignant neoplasm of gastrointestinal tract   . Fatty liver   . Follicular cyst of ovary   . Hyperlipidemia   . Irritable bowel syndrome   . Obesity, unspecified   . Personal history of colonic polyps   . PONV (postoperative nausea and vomiting)   . Type II or unspecified type diabetes mellitus without mention of complication, not stated as uncontrolled   . Unspecified gastritis and gastroduodenitis without mention of hemorrhage   . Unspecified hypothyroidism    Social History  Socioeconomic History  . Marital status: Married    Spouse name: Not on file  . Number of children: 1  . Years of education: Not on file  . Highest education level: Not on file  Social Needs  . Financial resource strain: Not on file  . Food insecurity - worry: Not on file  . Food insecurity - inability: Not on file  . Transportation needs - medical: Not on file  . Transportation needs - non-medical: Not on file  Occupational History  . Occupation: Facilities manager: [employer]  Tobacco Use  . Smoking status: Former Smoker    Packs/day: 0.50    Years: 26.00    Pack years: 13.00    Types: Cigarettes    Start date:  11/07/1978    Last attempt to quit: 06/15/2017    Years since quitting: 0.3  . Smokeless tobacco: Never Used  Substance and Sexual Activity  . Alcohol use: No    Alcohol/week: 0.0 oz    Comment: occasionally  . Drug use: No  . Sexual activity: Yes    Birth control/protection: None  Other Topics Concern  . Not on file  Social History Narrative   Patient is married with one daughter. 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 beverages daily, no alcohol   07/10/2016   Past Surgical History:  Procedure Laterality Date  . ABDOMINAL HYSTERECTOMY    . CESAREAN SECTION  1999  . CHOLECYSTECTOMY  2011  . COLONOSCOPY    . COLONOSCOPY N/A 04/03/2017   Procedure: COLONOSCOPY;  Surgeon: Irene Shipper, MD;  Location: [facility] ENDOSCOPY;  Service: Endoscopy;  Laterality: N/A;  . OVARIAN CYST REMOVAL  2002  . TONSILLECTOMY  1994  . UPPER GASTROINTESTINAL ENDOSCOPY     Family History  Problem Relation Age of Onset  . Colon cancer Maternal Aunt        cousin  . Cancer Maternal Aunt        colon  . Breast cancer Mother   . Cancer Mother        breast  . Other Mother        car accident  . Other Father        liver failure  . Cancer Cousin        colon  . Colon cancer Cousin   . Esophageal cancer Maternal Uncle   . Colon cancer Maternal Uncle   . Heart disease Paternal Grandmother   . Rectal cancer Neg Hx   . Stomach cancer Neg Hx    Allergies  Allergen Reactions  . Cleocin [Clindamycin Hcl] Anaphylaxis and Shortness Of Breath    Blood pressure dropping  . Penicillins Anaphylaxis and Swelling    Has patient had a PCN reaction causing immediate rash, facial/tongue/throat swelling, SOB or lightheadedness with hypotension: Yes Has patient had a PCN reaction causing severe rash involving mucus membranes or skin necrosis:no Has patient had a PCN reaction that required hospitalization: yes Has patient had a PCN reaction occurring within the last 10 years:no If all of the  above answers are "NO", then may proceed with Cephalosporin use.   . Mushroom Extract Complex Nausea And Vomiting and Other (See Comments)    Headache   . Amoxicillin Nausea Only  . Clarithromycin Nausea And Vomiting      Assessment & Plan:  The patient was last seen on June 23, 2017 for evaluation of claudication.  The patient underwent an ABI at [facility] clinic which  was essentially hhowever was still referred to [place] to rule out any contributing peripheral artery disease.  The patient does endorse a history of "back problems".  Patient does note that she has been diagnosed in the past with a "disc issue".  The patient underwent a bilateral ABI which was notable for no significant lower extremity arterial disease.  The patient was noted to have triphasic tibials bilaterally.  The bilateral toe brachial indices were normal.  The patient denies any rest pain or ulceration to the lower extremity.  The patient denies any fever, nausea or vomiting.  1. Claudication ([place]) - Stable Patient underwent a bilateral ABI which was notable for triphasic tibials bilaterally and no evidence of significant lower extremity arterial disease. Patient does have a history of "disc issues" in her spine This may be the cause of the patient's claudication. I have encouraged the patient's to follow-up with her primary care physician for further workup The patient is to follow-up with [place] as needed  2. Diabetes mellitus type 2, diet-controlled ([place]) - Stable Encouraged good control as its slows the progression of atherosclerotic disease  3. Hyperlipidemia, unspecified hyperlipidemia type - Stable Encouraged good control as its slows the progression of atherosclerotic disease  Current Outpatient Medications on File Prior to Visit  Medication Sig Dispense Refill  . aspirin EC 81 MG tablet Take 81 mg by mouth daily.    . meloxicam (MOBIC) 7.5 MG tablet     . Multiple Vitamins-Minerals (MULTIVITAMIN WITH MINERALS)  tablet Take 1 tablet by mouth daily.    . Omega-3 Fatty Acids (FISH OIL) 1200 MG CAPS Take 1,200 mg by mouth 2 (two) times daily.    . sertraline (ZOLOFT) 50 MG tablet Take 50 mg by mouth at bedtime.    . simvastatin (ZOCOR) 20 MG tablet Take 20 mg by mouth daily.    . ondansetron (ZOFRAN) 8 MG tablet Take 1 tablet (8 mg total) by mouth every 8 (eight) hours as needed for nausea or vomiting. (Patient not taking: Reported on 10/28/2017) 20 tablet 0   No current facility-administered medications on file prior to visit.    There are no Patient Instructions on file for this visit. No Follow-up on file.  KIMBERLY A STEGMAYER, PA-C

## 2017-12-04 DIAGNOSIS — D485 Neoplasm of uncertain behavior of skin: Secondary | ICD-10-CM | POA: Diagnosis not present

## 2017-12-04 DIAGNOSIS — B078 Other viral warts: Secondary | ICD-10-CM | POA: Diagnosis not present

## 2017-12-04 DIAGNOSIS — L821 Other seborrheic keratosis: Secondary | ICD-10-CM | POA: Diagnosis not present

## 2017-12-04 DIAGNOSIS — D225 Melanocytic nevi of trunk: Secondary | ICD-10-CM | POA: Diagnosis not present

## 2017-12-10 DIAGNOSIS — G4733 Obstructive sleep apnea (adult) (pediatric): Secondary | ICD-10-CM | POA: Diagnosis not present

## 2017-12-16 DIAGNOSIS — D485 Neoplasm of uncertain behavior of skin: Secondary | ICD-10-CM | POA: Diagnosis not present

## 2017-12-16 DIAGNOSIS — L98499 Non-pressure chronic ulcer of skin of other sites with unspecified severity: Secondary | ICD-10-CM | POA: Diagnosis not present

## 2018-01-10 DIAGNOSIS — G4733 Obstructive sleep apnea (adult) (pediatric): Secondary | ICD-10-CM | POA: Diagnosis not present

## 2018-01-14 DIAGNOSIS — G4733 Obstructive sleep apnea (adult) (pediatric): Secondary | ICD-10-CM | POA: Diagnosis not present

## 2018-01-29 DIAGNOSIS — M1712 Unilateral primary osteoarthritis, left knee: Secondary | ICD-10-CM | POA: Diagnosis not present

## 2018-01-29 DIAGNOSIS — M25562 Pain in left knee: Secondary | ICD-10-CM | POA: Diagnosis not present

## 2018-02-09 DIAGNOSIS — G4733 Obstructive sleep apnea (adult) (pediatric): Secondary | ICD-10-CM | POA: Diagnosis not present

## 2018-02-10 DIAGNOSIS — I739 Peripheral vascular disease, unspecified: Secondary | ICD-10-CM | POA: Diagnosis not present

## 2018-02-10 DIAGNOSIS — R7309 Other abnormal glucose: Secondary | ICD-10-CM | POA: Diagnosis not present

## 2018-02-10 DIAGNOSIS — E559 Vitamin D deficiency, unspecified: Secondary | ICD-10-CM | POA: Diagnosis not present

## 2018-02-17 DIAGNOSIS — Z9989 Dependence on other enabling machines and devices: Secondary | ICD-10-CM | POA: Diagnosis not present

## 2018-02-17 DIAGNOSIS — G4733 Obstructive sleep apnea (adult) (pediatric): Secondary | ICD-10-CM | POA: Diagnosis not present

## 2018-02-17 DIAGNOSIS — Z0000 Encounter for general adult medical examination without abnormal findings: Secondary | ICD-10-CM | POA: Diagnosis not present

## 2018-02-18 DIAGNOSIS — G4733 Obstructive sleep apnea (adult) (pediatric): Secondary | ICD-10-CM | POA: Diagnosis not present

## 2018-03-08 DIAGNOSIS — G4733 Obstructive sleep apnea (adult) (pediatric): Secondary | ICD-10-CM | POA: Diagnosis not present

## 2018-03-12 DIAGNOSIS — G4733 Obstructive sleep apnea (adult) (pediatric): Secondary | ICD-10-CM | POA: Diagnosis not present

## 2018-04-02 ENCOUNTER — Other Ambulatory Visit: Payer: Self-pay | Admitting: Obstetrics and Gynecology

## 2018-04-02 DIAGNOSIS — Z1231 Encounter for screening mammogram for malignant neoplasm of breast: Secondary | ICD-10-CM

## 2018-04-11 DIAGNOSIS — G4733 Obstructive sleep apnea (adult) (pediatric): Secondary | ICD-10-CM | POA: Diagnosis not present

## 2018-04-30 ENCOUNTER — Ambulatory Visit
Admission: RE | Admit: 2018-04-30 | Discharge: 2018-04-30 | Disposition: A | Discharge status: Home / Self Care | Payer: 59 | Source: Ambulatory Visit | Attending: Obstetrics and Gynecology | Admitting: Obstetrics and Gynecology

## 2018-04-30 DIAGNOSIS — Z1231 Encounter for screening mammogram for malignant neoplasm of breast: Secondary | ICD-10-CM

## 2018-05-12 DIAGNOSIS — G4733 Obstructive sleep apnea (adult) (pediatric): Secondary | ICD-10-CM | POA: Diagnosis not present

## 2018-05-16 DIAGNOSIS — R2242 Localized swelling, mass and lump, left lower limb: Secondary | ICD-10-CM | POA: Diagnosis not present

## 2018-05-16 DIAGNOSIS — R7989 Other specified abnormal findings of blood chemistry: Secondary | ICD-10-CM | POA: Diagnosis not present

## 2018-05-16 DIAGNOSIS — M79605 Pain in left leg: Secondary | ICD-10-CM | POA: Diagnosis not present

## 2018-05-16 DIAGNOSIS — R791 Abnormal coagulation profile: Secondary | ICD-10-CM | POA: Diagnosis not present

## 2018-05-17 DIAGNOSIS — R7989 Other specified abnormal findings of blood chemistry: Secondary | ICD-10-CM | POA: Diagnosis not present

## 2018-05-17 DIAGNOSIS — R600 Localized edema: Secondary | ICD-10-CM | POA: Diagnosis not present

## 2018-05-19 DIAGNOSIS — R600 Localized edema: Secondary | ICD-10-CM | POA: Diagnosis not present

## 2018-05-27 DIAGNOSIS — M79662 Pain in left lower leg: Secondary | ICD-10-CM | POA: Diagnosis not present

## 2018-06-12 DIAGNOSIS — G4733 Obstructive sleep apnea (adult) (pediatric): Secondary | ICD-10-CM | POA: Diagnosis not present

## 2018-07-12 DIAGNOSIS — G4733 Obstructive sleep apnea (adult) (pediatric): Secondary | ICD-10-CM | POA: Diagnosis not present

## 2018-08-12 DIAGNOSIS — G4733 Obstructive sleep apnea (adult) (pediatric): Secondary | ICD-10-CM | POA: Diagnosis not present

## 2018-09-11 DIAGNOSIS — G4733 Obstructive sleep apnea (adult) (pediatric): Secondary | ICD-10-CM | POA: Diagnosis not present

## 2018-10-12 DIAGNOSIS — G4733 Obstructive sleep apnea (adult) (pediatric): Secondary | ICD-10-CM | POA: Diagnosis not present

## 2018-12-16 DIAGNOSIS — K210 Gastro-esophageal reflux disease with esophagitis: Secondary | ICD-10-CM | POA: Diagnosis not present

## 2019-04-11 ENCOUNTER — Other Ambulatory Visit: Payer: 59

## 2019-04-11 ENCOUNTER — Telehealth: Payer: Self-pay

## 2019-04-11 DIAGNOSIS — Z20822 Contact with and (suspected) exposure to covid-19: Secondary | ICD-10-CM

## 2019-04-11 NOTE — Telephone Encounter (Signed)
[facility] Department referral for the patient to have covid testing. I called the patient and advised, appointment scheduled for today at 1545 at [facility], advised of location and to wear a mask for everyone in the vehicle, she verbalized understanding.

## 2019-04-16 LAB — NOVEL CORONAVIRUS, NAA: SARS-CoV-2, NAA: NOT DETECTED

## 2019-04-22 NOTE — Telephone Encounter (Signed)
Negative results was given to pt °

## 2019-07-13 ENCOUNTER — Other Ambulatory Visit: Payer: Self-pay

## 2019-07-13 DIAGNOSIS — Z20822 Contact with and (suspected) exposure to covid-19: Secondary | ICD-10-CM

## 2019-07-14 LAB — NOVEL CORONAVIRUS, NAA: SARS-CoV-2, NAA: NOT DETECTED

## 2019-07-17 IMAGING — CT CT ABD-PELV W/ CM
1 of 3 series · 13 of 32 positions shown, 18 images · IV contrast (iopamidol)
Comparison: 04/02/2017

CLINICAL DATA: Recheck Appendicitis with abx x 4 / no complaints
Cyst on ovary / gb/uterus No hx of ca No injury

EXAM:
CT ABDOMEN AND PELVIS WITH CONTRAST
TECHNIQUE: Multidetector CT imaging of the abdomen and pelvis was performed
using the standard protocol following bolus administration of
intravenous contrast.
CONTRAST:  125mL 5EMIXX-AOO IOPAMIDOL (5EMIXX-AOO) INJECTION 61%

[Series 2: abd/pelvis w/cm · axial · 0.91mm/px · z∈[-483,-43]mm · 13 of 100 slices shown, 18 images]
[im 6/100  soft-tissue]
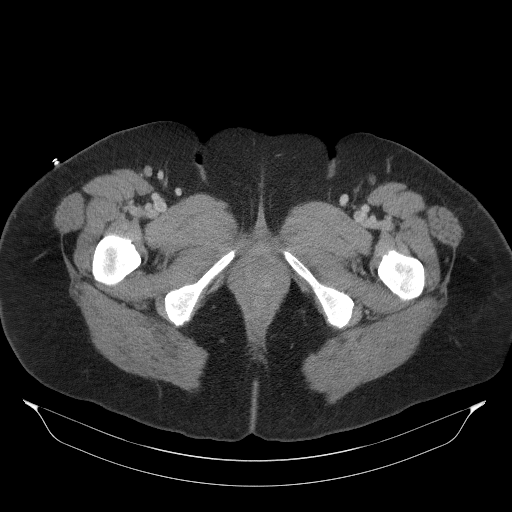
[im 6/100  bone]
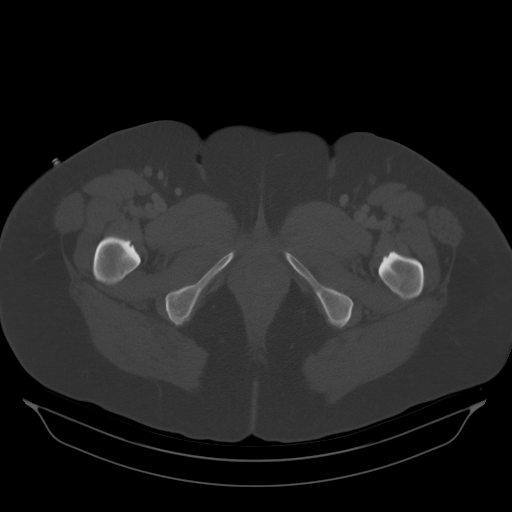
[im 16/100  soft-tissue]
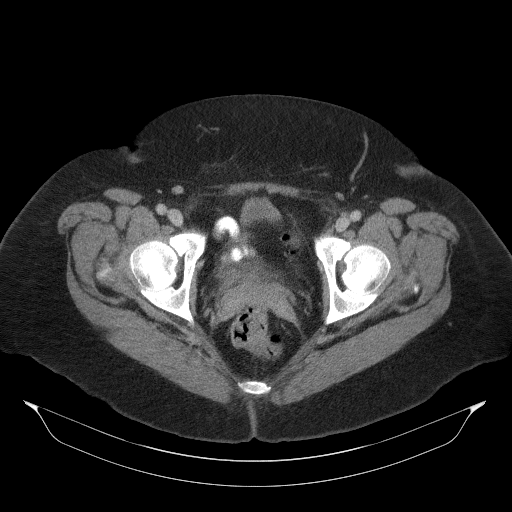
[im 21/100  soft-tissue]
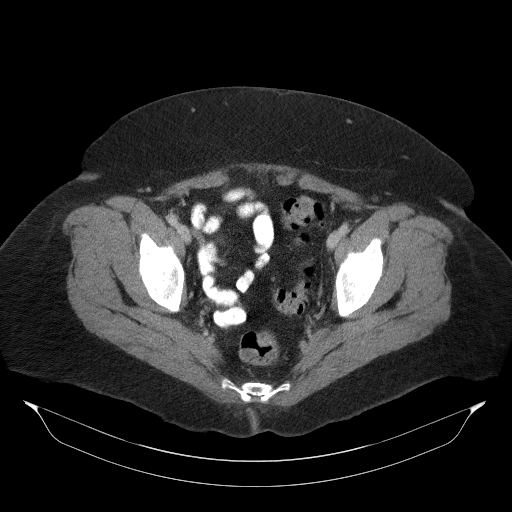
[im 32/100  soft-tissue]
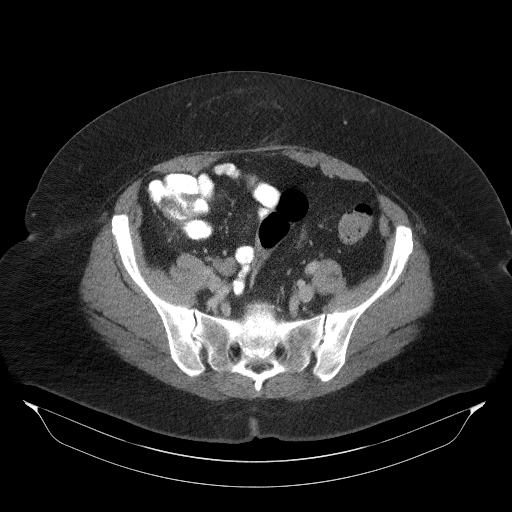
[im 37/100  soft-tissue]
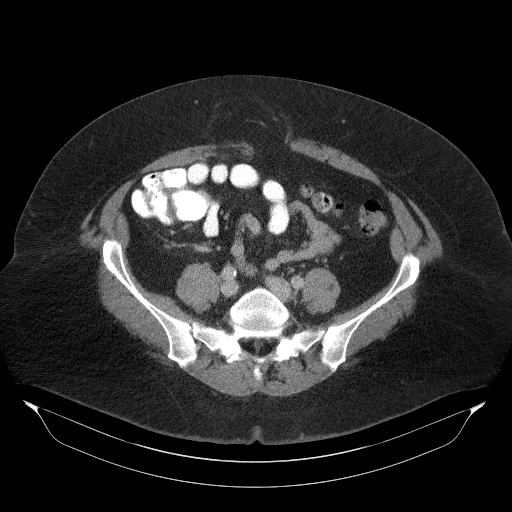
[im 47/100  soft-tissue]
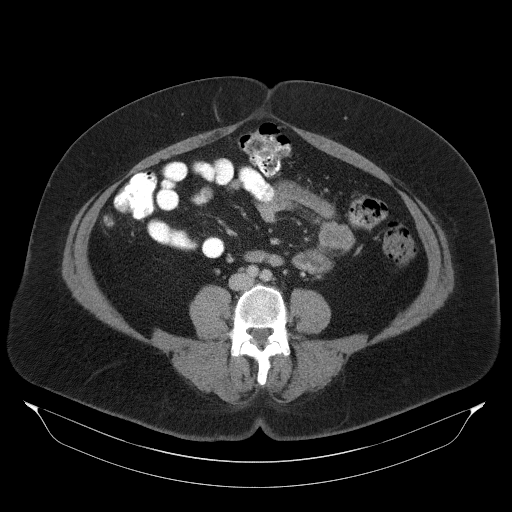
[im 53/100  soft-tissue]
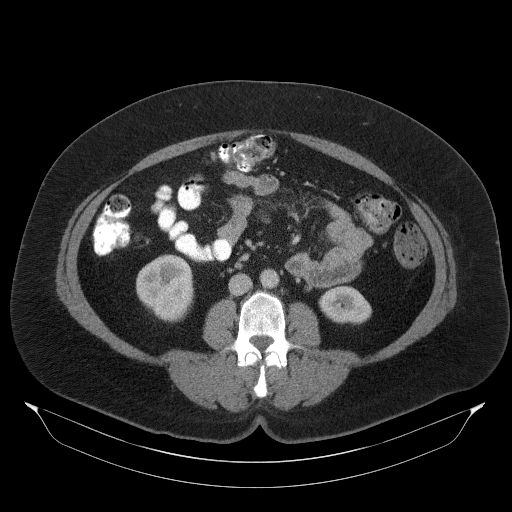
[im 63/100  soft-tissue]
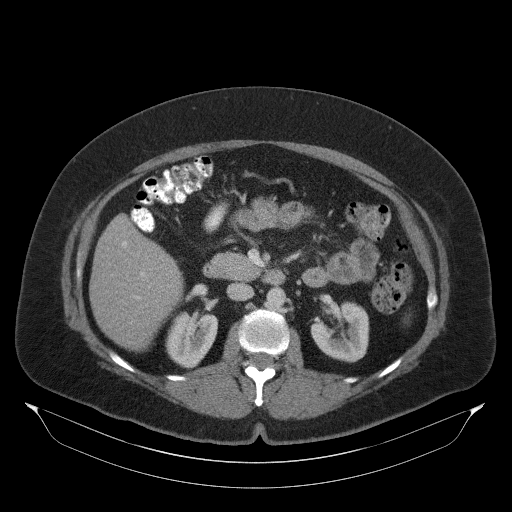
[im 68/100  soft-tissue]
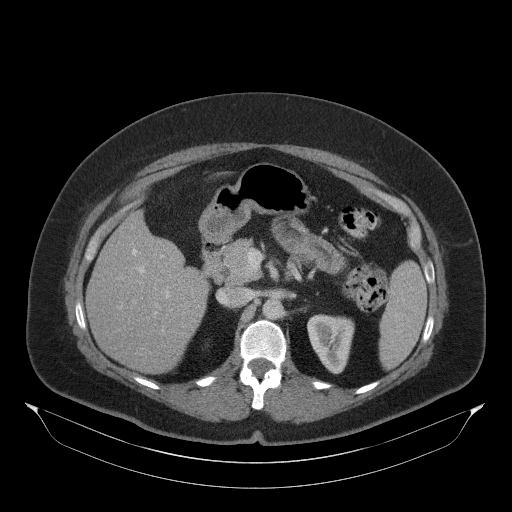
[im 68/100  bone]
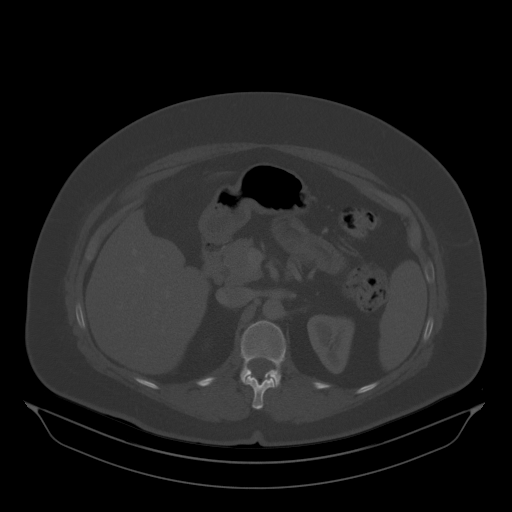
[im 79/100  soft-tissue]
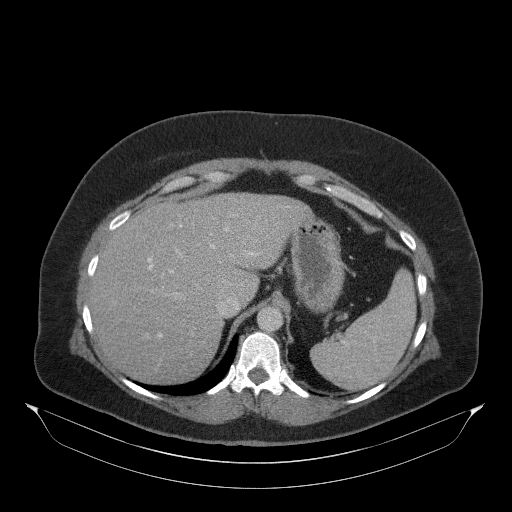
[im 79/100  lung]
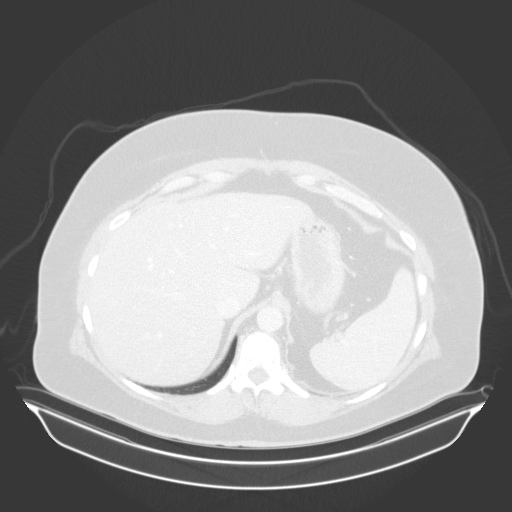
[im 84/100  soft-tissue]
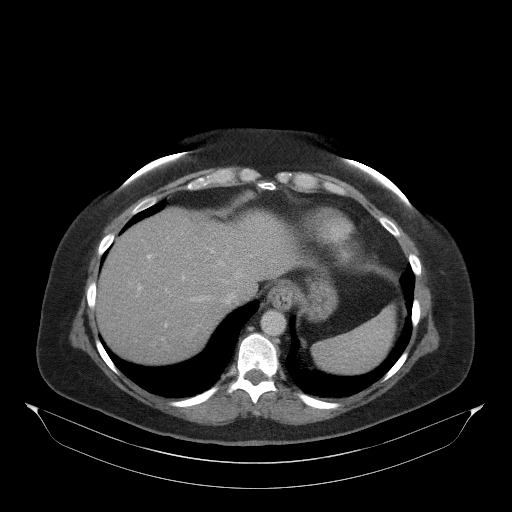
[im 84/100  lung]
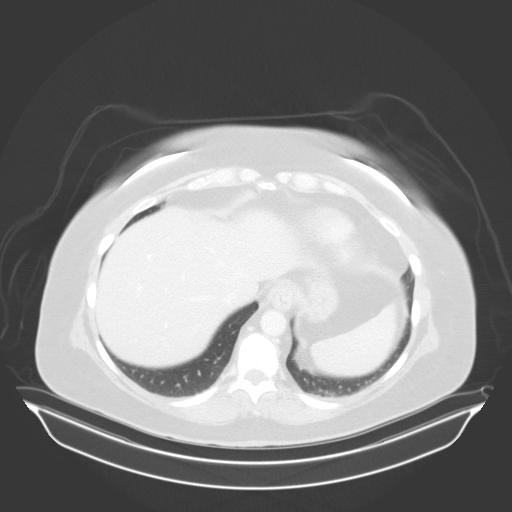
[im 89/100  lung]
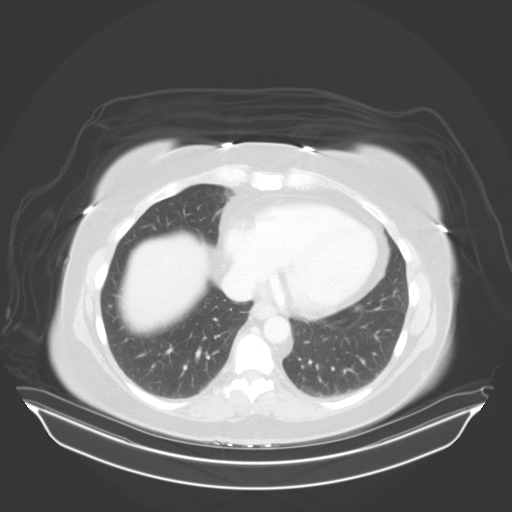
[im 94/100  soft-tissue]
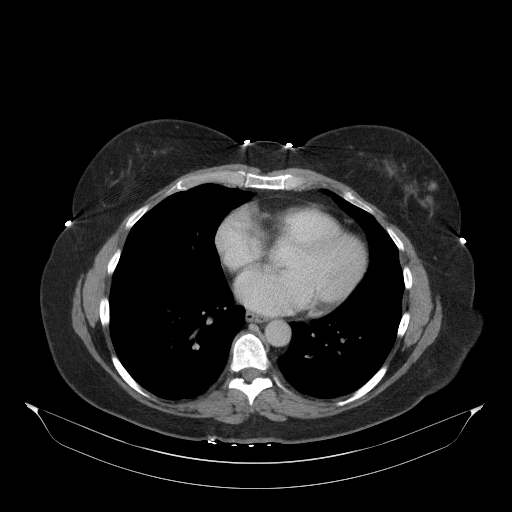
[im 94/100  lung]
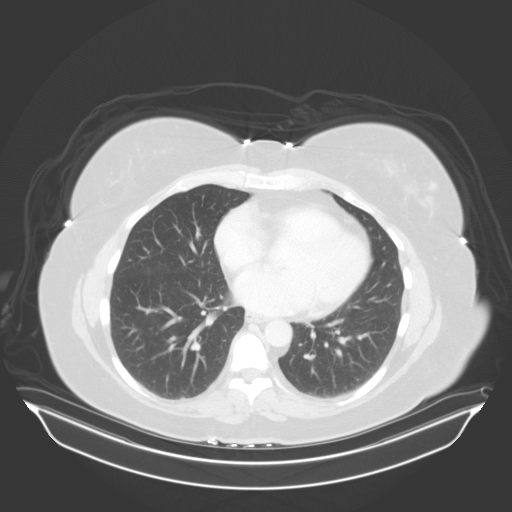

[13 of 32 positions shown; findings below may reference images not displayed]

FINDINGS: Lower chest: No acute abnormality.

Hepatobiliary: Borderline fatty infiltration of the liver. Tiny
low-density lesion in the segment 8, likely a cyst. No other liver
lesions. Status post cholecystectomy. No bile duct dilation.

Pancreas: Unremarkable. No pancreatic ductal dilatation or
surrounding inflammatory changes.

Spleen: Normal in size without focal abnormality.

Adrenals/Urinary Tract: Adrenal glands are unremarkable. Kidneys are
normal, without renal calculi, focal lesion, or hydronephrosis.
Bladder is unremarkable.

Stomach/Bowel: The appendix now measures 7 mm in maximum dimension.
There is a small amount of intraluminal air at the appendiceal base.
The distal appendix is smaller measuring 4 mm. Inflammatory changes
noted previously have mostly resolved.

Stomach and small bowel are unremarkable. There are scattered small
colonic diverticula. No diverticulitis. No colonic wall thickening
or adjacent inflammation.

Vascular/Lymphatic: Prominent ileocolic chain lymph nodes adjacent
to the cecum noted previously have decreased in size. There is no
adenopathy. Vascular structures are unremarkable.

Reproductive: Status post hysterectomy. No adnexal masses.

Other: Infraumbilical midline fat containing hernia is unchanged. No
ascites. No evidence of an abscess.

Musculoskeletal: Chronic bilateral pars defects L5-S1 with a slight
anterolisthesis. No acute fracture. No osteoblastic or osteolytic
lesions.
IMPRESSION: 1. The findings suggesting acute appendicitis on the prior study has
essentially resolved. The appendix measures a maximum of 7 mm in
diameter at its base where it had measured 9 mm. There are no
significant residual periappendiceal inflammatory changes. The
previously described prominent right lower quadrant mesenteric lymph
nodes have notably decreased in size.
2. No new abnormalities. No evidence of an abscess or other acute
finding.
3. Stable fat containing infraumbilical midline hernia.

## 2019-08-31 ENCOUNTER — Other Ambulatory Visit: Payer: Self-pay | Admitting: Obstetrics and Gynecology

## 2019-08-31 DIAGNOSIS — E042 Nontoxic multinodular goiter: Secondary | ICD-10-CM

## 2019-09-07 ENCOUNTER — Ambulatory Visit
Admission: RE | Admit: 2019-09-07 | Discharge: 2019-09-07 | Disposition: A | Discharge status: Home / Self Care | Payer: 59 | Source: Ambulatory Visit | Attending: Obstetrics and Gynecology | Admitting: Obstetrics and Gynecology

## 2019-09-07 DIAGNOSIS — E042 Nontoxic multinodular goiter: Secondary | ICD-10-CM

## 2019-11-14 ENCOUNTER — Other Ambulatory Visit: Payer: Self-pay | Admitting: Obstetrics and Gynecology

## 2019-11-14 DIAGNOSIS — Z803 Family history of malignant neoplasm of breast: Secondary | ICD-10-CM

## 2020-04-27 ENCOUNTER — Ambulatory Visit
Admission: EM | Admit: 2020-04-27 | Discharge: 2020-04-27 | Disposition: A | Discharge status: Home / Self Care | Payer: 59 | Attending: Emergency Medicine | Admitting: Emergency Medicine

## 2020-04-27 ENCOUNTER — Other Ambulatory Visit: Payer: Self-pay

## 2020-04-27 ENCOUNTER — Encounter: Payer: Self-pay | Admitting: Emergency Medicine

## 2020-04-27 DIAGNOSIS — R1084 Generalized abdominal pain: Secondary | ICD-10-CM | POA: Diagnosis not present

## 2020-04-27 LAB — POCT URINALYSIS DIP (MANUAL ENTRY)
Bilirubin, UA: NEGATIVE
Blood, UA: NEGATIVE
Glucose, UA: NEGATIVE mg/dL
Ketones, POC UA: NEGATIVE mg/dL
Leukocytes, UA: NEGATIVE
Nitrite, UA: NEGATIVE
Protein Ur, POC: NEGATIVE mg/dL
Spec Grav, UA: >=1.03 — AB (ref 1.010–1.025)
Urobilinogen, UA: 0.2 E.U./dL
pH, UA: 6 (ref 5.0–8.0)

## 2020-04-27 NOTE — Discharge Instructions (Signed)
Urine without signs of infection Given hx of appendicitis that was treated with antibiotics I am recommending further evaluation and management in the ED.  Cannot rule out appendicitis in Urgent Care setting.  Patient aware and in agreement with plan.  Will go by private vehicle to [facility] ED.

## 2020-04-27 NOTE — ED Triage Notes (Signed)
Fatigued, low back pain and lower abd pain x 2weeks.  Urinary frequency, odor and burning.  Pt seen ortho yesterday and had xray which was neg, he advised pt to get checked for UTI.

## 2020-04-27 NOTE — ED Provider Notes (Signed)
[facility]   CC: Back and abdominal pain  SUBJECTIVE:  Lori Barber is a 54 y.o. female who complains of urinary frequency, urgency and dysuria for the past 2 weeks.  Patient denies a precipitating event, or trauma.  Localizes the pain to the lower abdomen and bilateral mid back.  Pain is intermittent and describes it as aching  Has tried OTC medications without relief.  Hx significant for appendicitis, states she was treated with antibiotics in the past.  She did not undergo appendectomy.  Symptoms today remind her of when she had appendicitis.  Denies fever, chills, nausea, vomiting, flank pain, abnormal vaginal discharge, hematuria.    LMP: No LMP recorded. Patient has had a hysterectomy.  ROS: As in HPI.  All other pertinent ROS negative.     Past Medical History:  Diagnosis Date   Abdominal pain, epigastric    Allergy    mold,dust, animal dander   Anal fissure    Anemia    Anxiety    Esophageal reflux    Esophageal stricture    Esophagitis, unspecified    Family history of malignant neoplasm of gastrointestinal tract    Fatty liver    Follicular cyst of ovary    Hyperlipidemia    Irritable bowel syndrome    Obesity, unspecified    Personal history of colonic polyps    PONV (postoperative nausea and vomiting)    Type II or unspecified type diabetes mellitus without mention of complication, not stated as uncontrolled    borderline, controlled by diet    Unspecified gastritis and gastroduodenitis without mention of hemorrhage    Past Surgical History:  Procedure Laterality Date   ABDOMINAL HYSTERECTOMY     CESAREAN SECTION  1999   CHOLECYSTECTOMY  2011   COLONOSCOPY     COLONOSCOPY N/A 04/03/2017   Procedure: COLONOSCOPY;  Surgeon: Irene Shipper, MD;  Location: [facility] ENDOSCOPY;  Service: Endoscopy;  Laterality: N/A;   OVARIAN CYST REMOVAL  2002   TONSILLECTOMY  1994   UPPER GASTROINTESTINAL ENDOSCOPY     Allergies  Allergen  Reactions   Cleocin [Clindamycin Hcl] Anaphylaxis and Shortness Of Breath    Blood pressure dropping   Penicillins Anaphylaxis and Swelling    Has patient had a PCN reaction causing immediate rash, facial/tongue/throat swelling, SOB or lightheadedness with hypotension: Yes Has patient had a PCN reaction causing severe rash involving mucus membranes or skin necrosis:no Has patient had a PCN reaction that required hospitalization: yes Has patient had a PCN reaction occurring within the last 10 years:no If all of the above answers are "NO", then may proceed with Cephalosporin use.    Mushroom Extract Complex Nausea And Vomiting and Other (See Comments)    Headache    Amoxicillin Nausea Only   Clarithromycin Nausea And Vomiting   No current facility-administered medications on file prior to encounter.   Current Outpatient Medications on File Prior to Encounter  Medication Sig Dispense Refill   citalopram (CELEXA) 20 MG tablet Take 20 mg by mouth daily.     omeprazole (PRILOSEC) 20 MG capsule Take 20 mg by mouth as needed.     simvastatin (ZOCOR) 20 MG tablet Take 20 mg by mouth daily.     [DISCONTINUED] sertraline (ZOLOFT) 50 MG tablet Take 50 mg by mouth at bedtime.     Social History   Socioeconomic History   Marital status: Married    Spouse name: Not on file   Number of children: 1  Years of education: Not on file   Highest education level: Not on file  Occupational History   Occupation: Web designer    Employer: [employer]  Tobacco Use   Smoking status: Former Smoker    Packs/day: 0.50    Years: 26.00    Pack years: 13.00    Types: Cigarettes    Start date: 11/07/1978    Quit date: 06/15/2017    Years since quitting: 2.8   Smokeless tobacco: Never Used  Vaping Use   Vaping Use: Never used  Substance and Sexual Activity   Alcohol use: No    Alcohol/week: 0.0 standard drinks    Comment: occasionally   Drug use: No   Sexual activity: Yes     Birth control/protection: None  Other Topics Concern   Not on file  Social History Narrative   Patient is married with one daughter. 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 beverages daily, no alcohol   07/10/2016   Social Determinants of Health   Financial Resource Strain:    Difficulty of Paying Living Expenses:   Food Insecurity:    Worried About Charity fundraiser in the Last Year:    Arboriculturist in the Last Year:   Transportation Needs:    Film/video editor (Medical):    Lack of Transportation (Non-Medical):   Physical Activity:    Days of Exercise per Week:    Minutes of Exercise per Session:   Stress:    Feeling of Stress :   Social Connections:    Frequency of Communication with Friends and Family:    Frequency of Social Gatherings with Friends and Family:    Attends Religious Services:    Active Member of Clubs or Organizations:    Attends Music therapist:    Marital Status:   Intimate Partner Violence:    Fear of Current or Ex-Partner:    Emotionally Abused:    Physically Abused:    Sexually Abused:    Family History  Problem Relation Age of Onset   Colon cancer Maternal Aunt        cousin   Cancer Maternal Aunt        colon   Breast cancer Mother    Cancer Mother        breast   Other Mother        car accident   Other Father        liver failure   Cancer Cousin        colon   Colon cancer Cousin    Esophageal cancer Maternal Uncle    Colon cancer Maternal Uncle    Heart disease Paternal Grandmother    Rectal cancer Neg Hx    Stomach cancer Neg Hx     OBJECTIVE:  Vitals:   04/27/20 1332 04/27/20 1336  BP:  107/74  Pulse:  69  Resp:  18  Temp:  98 F (36.7 C)  TempSrc:  Oral  SpO2:  94%  Weight: (!) 235 lb (106.6 kg)   Height: 5\' 5"  (1.651 m)    General appearance: Alert HEENT: NCAT.  Oropharynx clear.  Lungs: clear to auscultation bilaterally without  adventitious breath sounds Heart: regular rate and rhythm.   Abdomen: soft; non-distended; mild diffuse TTP over abdomen; bowel sounds present; no guarding Back: no CVA tenderness Extremities: no edema; symmetrical with no gross deformities Skin: warm and dry Neurologic: Ambulates from chair to exam table without difficulty Psychological:  alert and cooperative; normal mood and affect  Labs Reviewed  POCT URINALYSIS DIP (MANUAL ENTRY) - Abnormal; Notable for the following components:      Result Value   Spec Grav, UA >=1.030 (*)    All other components within normal limits  URINE CULTURE    ASSESSMENT & PLAN:  1. Generalized abdominal pain    Urine without signs of infection Given hx of appendicitis that was treated with antibiotics I am recommending further evaluation and management in the ED.  Cannot rule out appendicitis in Urgent Care setting.  Patient aware and in agreement with plan.  Will go by private vehicle to [facility] ED.    Outlined signs and symptoms indicating need for more acute intervention. Patient verbalized understanding. After Visit Summary given.     Lestine Box, PA-C 04/27/20 1414

## 2020-04-28 ENCOUNTER — Other Ambulatory Visit: Payer: Self-pay

## 2020-04-28 ENCOUNTER — Emergency Department (HOSPITAL_COMMUNITY)
Admission: EM | Admit: 2020-04-28 | Discharge: 2020-04-28 | Disposition: A | Discharge status: Home / Self Care | Payer: 59 | Attending: Emergency Medicine | Admitting: Emergency Medicine

## 2020-04-28 ENCOUNTER — Emergency Department (HOSPITAL_COMMUNITY): Payer: 59

## 2020-04-28 ENCOUNTER — Encounter (HOSPITAL_COMMUNITY): Payer: Self-pay

## 2020-04-28 DIAGNOSIS — Z87891 Personal history of nicotine dependence: Secondary | ICD-10-CM | POA: Diagnosis not present

## 2020-04-28 DIAGNOSIS — K219 Gastro-esophageal reflux disease without esophagitis: Secondary | ICD-10-CM | POA: Diagnosis not present

## 2020-04-28 DIAGNOSIS — R1084 Generalized abdominal pain: Secondary | ICD-10-CM | POA: Diagnosis present

## 2020-04-28 DIAGNOSIS — E039 Hypothyroidism, unspecified: Secondary | ICD-10-CM | POA: Diagnosis not present

## 2020-04-28 DIAGNOSIS — E119 Type 2 diabetes mellitus without complications: Secondary | ICD-10-CM | POA: Diagnosis not present

## 2020-04-28 DIAGNOSIS — Z79899 Other long term (current) drug therapy: Secondary | ICD-10-CM | POA: Diagnosis not present

## 2020-04-28 LAB — URINE CULTURE
Culture: NO GROWTH
Special Requests: NORMAL

## 2020-04-28 LAB — CBC
HCT: 39.8 % (ref 36.0–46.0)
Hemoglobin: 13 g/dL (ref 12.0–15.0)
MCH: 30.7 pg (ref 26.0–34.0)
MCHC: 32.7 g/dL (ref 30.0–36.0)
MCV: 93.9 fL (ref 80.0–100.0)
Platelets: 253 10*3/uL (ref 150–400)
RBC: 4.24 MIL/uL (ref 3.87–5.11)
RDW: 12.8 % (ref 11.5–15.5)
WBC: 7.8 10*3/uL (ref 4.0–10.5)
nRBC: 0 % (ref 0.0–0.2)

## 2020-04-28 LAB — URINALYSIS, ROUTINE W REFLEX MICROSCOPIC
Bilirubin Urine: NEGATIVE
Glucose, UA: NEGATIVE mg/dL
Hgb urine dipstick: NEGATIVE
Ketones, ur: NEGATIVE mg/dL
Nitrite: NEGATIVE
Protein, ur: NEGATIVE mg/dL
Specific Gravity, Urine: 1.032 — ABNORMAL HIGH (ref 1.005–1.030)
pH: 5 (ref 5.0–8.0)

## 2020-04-28 LAB — COMPREHENSIVE METABOLIC PANEL
ALT: 29 U/L (ref 0–44)
AST: 31 U/L (ref 15–41)
Albumin: 3.7 g/dL (ref 3.5–5.0)
Alkaline Phosphatase: 69 U/L (ref 38–126)
Anion gap: 9 (ref 5–15)
BUN: 20 mg/dL (ref 6–20)
CO2: 25 mmol/L (ref 22–32)
Calcium: 9.2 mg/dL (ref 8.9–10.3)
Chloride: 104 mmol/L (ref 98–111)
Creatinine, Ser: 0.81 mg/dL (ref 0.44–1.00)
GFR calc Af Amer: >60 mL/min (ref >60)
GFR calc non Af Amer: >60 mL/min (ref >60)
Glucose, Bld: 101 mg/dL — ABNORMAL HIGH (ref 70–99)
Potassium: 3.9 mmol/L (ref 3.5–5.1)
Sodium: 138 mmol/L (ref 135–145)
Total Bilirubin: 0.5 mg/dL (ref 0.3–1.2)
Total Protein: 7.8 g/dL (ref 6.5–8.1)

## 2020-04-28 LAB — LIPASE, BLOOD: Lipase: 52 U/L — ABNORMAL HIGH (ref 11–51)

## 2020-04-28 MED ORDER — HYDROCODONE-ACETAMINOPHEN 5-325 MG PO TABS: 1.0000 | ORAL_TABLET | Freq: Four times a day (QID) | ORAL | 0 refills | Status: DC | PRN

## 2020-04-28 MED ORDER — IOHEXOL 300 MG/ML  SOLN
100.0000 mL | Freq: Once | INTRAMUSCULAR | Status: AC | PRN
  Administered 2020-04-28: 100 mL via INTRAVENOUS

## 2020-04-28 NOTE — ED Provider Notes (Signed)
[facility] EMERGENCY DEPARTMENT Provider Note   CSN: [id number] Arrival date & time: 04/28/20  6144     History Chief Complaint  Patient presents with  . Abdominal Pain    Lori Barber is a 54 y.o. female.  Patient complains of lower back pain and abdominal discomfort.  Patient has a history of appendicitis that was treated with antibiotics and she stated her symptoms are similar.  Patient also has abdominal bloating  The history is provided by the patient and medical records. No language interpreter was used.  Abdominal Pain Pain location:  Generalized Pain quality: aching   Pain radiates to:  Does not radiate Pain severity:  Mild Onset quality:  Sudden Timing:  Constant Chronicity:  New Context: not alcohol use   Associated symptoms: no chest pain, no cough, no diarrhea, no fatigue and no hematuria        Past Medical History:  Diagnosis Date  . Abdominal pain, epigastric   . Allergy    mold,dust, animal dander  . Anal fissure   . Anemia   . Anxiety   . Esophageal reflux   . Esophageal stricture   . Esophagitis, unspecified   . Family history of malignant neoplasm of gastrointestinal tract   . Fatty liver   . Follicular cyst of ovary   . Hyperlipidemia   . Irritable bowel syndrome   . Obesity, unspecified   . Personal history of colonic polyps   . PONV (postoperative nausea and vomiting)   . Type II or unspecified type diabetes mellitus without mention of complication, not stated as uncontrolled    borderline, controlled by diet   . Unspecified gastritis and gastroduodenitis without mention of hemorrhage     Patient Active Problem List   Diagnosis Date Noted  . Claudication ([place]) 10/28/2017  . Hyperlipidemia 06/23/2017  . Abnormal CT of the abdomen   . Inflammation of colonic mucosa   . RLQ abdominal pain 03/31/2017  . Chest pain 07/24/2013  . Diabetes mellitus type 2, diet-controlled ([place]) 07/24/2013  . Palpitations 07/24/2013  . Duodenitis  09/15/2011  . Gastritis 09/15/2011  . Nausea alone 09/15/2011  . GERD with stricture 09/15/2011  . Nausea 09/04/2011  . Diarrhea following gastrointestinal surgery 09/04/2011  . Hernia of unspecified site of abdominal cavity without mention of obstruction or gangrene 09/04/2011  . Obesity 09/04/2011  . S/P cholecystectomy 09/04/2011  . History of gastroesophageal reflux (GERD) 09/04/2011  . Ventral hernia 07/11/2011  . IBS (irritable bowel syndrome) 07/11/2011  . GERD (gastroesophageal reflux disease) 07/11/2011  . Fatty infiltration of liver 07/11/2011  . Bilateral ovarian cysts 07/11/2011  . Hx of cholecystectomy 07/11/2011  . ABDOMINAL WALL HERNIA 10/12/2009  . ARTHRALGIA 03/06/2009  . ESOPHAGITIS 01/22/2009  . GASTRITIS 12/08/2008  . OBESITY, UNSPECIFIED 12/07/2008  . ABDOMINAL PAIN-EPIGASTRIC 12/07/2008  . HYPOTHYROIDISM 11/09/2008  . HEMORRHOIDS 11/09/2008  . GERD 11/09/2008  . IBS 11/09/2008  . FATTY LIVER DISEASE 11/09/2008  . OVARIAN CYST 11/09/2008  . COLONIC POLYPS, ADENOMATOUS, HX OF 11/09/2008    Past Surgical History:  Procedure Laterality Date  . ABDOMINAL HYSTERECTOMY    . CESAREAN SECTION  1999  . CHOLECYSTECTOMY  2011  . COLONOSCOPY    . COLONOSCOPY N/A 04/03/2017   Procedure: COLONOSCOPY;  Surgeon: Irene Shipper, MD;  Location: [facility] ENDOSCOPY;  Service: Endoscopy;  Laterality: N/A;  . OVARIAN CYST REMOVAL  2002  . TONSILLECTOMY  1994  . UPPER GASTROINTESTINAL ENDOSCOPY       OB History  No obstetric history on file.     Family History  Problem Relation Age of Onset  . Colon cancer Maternal Aunt        cousin  . Cancer Maternal Aunt        colon  . Breast cancer Mother   . Cancer Mother        breast  . Other Mother        car accident  . Other Father        liver failure  . Cancer Cousin        colon  . Colon cancer Cousin   . Esophageal cancer Maternal Uncle   . Colon cancer Maternal Uncle   . Heart disease Paternal Grandmother   .  Rectal cancer Neg Hx   . Stomach cancer Neg Hx     Social History   Tobacco Use  . Smoking status: Former Smoker    Packs/day: 0.50    Years: 26.00    Pack years: 13.00    Types: Cigarettes    Start date: 11/07/1978    Quit date: 06/15/2017    Years since quitting: 2.8  . Smokeless tobacco: Never Used  Vaping Use  . Vaping Use: Never used  Substance Use Topics  . Alcohol use: No    Alcohol/week: 0.0 standard drinks    Comment: occasionally  . Drug use: No    Home Medications Prior to Admission medications   Medication Sig Start Date End Date Taking? Authorizing Provider  citalopram (CELEXA) 20 MG tablet Take 20 mg by mouth daily.    [provider]  HYDROcodone-acetaminophen (NORCO/VICODIN) 5-325 MG tablet Take 1 tablet by mouth every 6 (six) hours as needed. 04/28/20   Milton Ferguson, MD  omeprazole (PRILOSEC) 20 MG capsule Take 20 mg by mouth as needed.    [provider]  simvastatin (ZOCOR) 20 MG tablet Take 20 mg by mouth daily. 06/16/17 06/16/18  [provider]  sertraline (ZOLOFT) 50 MG tablet Take 50 mg by mouth at bedtime. 06/16/17 04/27/20  [provider]    Allergies    Cleocin [clindamycin hcl], Penicillins, Mushroom extract complex, Amoxicillin, and Clarithromycin  Review of Systems   Review of Systems  Constitutional: Negative for appetite change and fatigue.  HENT: Negative for congestion, ear discharge and sinus pressure.   Eyes: Negative for discharge.  Respiratory: Negative for cough.   Cardiovascular: Negative for chest pain.  Gastrointestinal: Positive for abdominal pain. Negative for diarrhea.  Genitourinary: Negative for frequency and hematuria.  Musculoskeletal: Negative for back pain.  Skin: Negative for rash.  Neurological: Negative for seizures and headaches.  Psychiatric/Behavioral: Negative for hallucinations.    Physical Exam Updated Vital Signs Temp 97.7 F (36.5 C) (Oral)   Ht 5\' 5"  (1.651 m)   Wt  (!) 106.6 kg   BMI 39.11 kg/m   Physical Exam Vitals and nursing note reviewed.  Constitutional:      Appearance: She is well-developed.  HENT:     Head: Normocephalic.     Nose: Nose normal.  Eyes:     General: No scleral icterus.    Conjunctiva/sclera: Conjunctivae normal.  Neck:     Thyroid: No thyromegaly.  Cardiovascular:     Rate and Rhythm: Normal rate and regular rhythm.     Heart sounds: No murmur heard.  No friction rub. No gallop.   Pulmonary:     Breath sounds: No stridor. No wheezing or rales.  Chest:     Chest  wall: No tenderness.  Abdominal:     General: There is no distension.     Tenderness: There is abdominal tenderness. There is no rebound.  Musculoskeletal:        General: Normal range of motion.     Cervical back: Neck supple.     Comments: Tender lumbar spine  Lymphadenopathy:     Cervical: No cervical adenopathy.  Skin:    Findings: No erythema or rash.  Neurological:     Mental Status: She is alert and oriented to person, place, and time.     Motor: No abnormal muscle tone.     Coordination: Coordination normal.  Psychiatric:        Behavior: Behavior normal.     ED Results / Procedures / Treatments   Labs (all labs ordered are listed, but only abnormal results are displayed) Labs Reviewed  LIPASE, BLOOD - Abnormal; Notable for the following components:      Result Value   Lipase 52 (*)    All other components within normal limits  COMPREHENSIVE METABOLIC PANEL - Abnormal; Notable for the following components:   Glucose, Bld 101 (*)    All other components within normal limits  URINALYSIS, ROUTINE W REFLEX MICROSCOPIC - Abnormal; Notable for the following components:   APPearance HAZY (*)    Specific Gravity, Urine 1.032 (*)    Leukocytes,Ua TRACE (*)    Bacteria, UA RARE (*)    All other components within normal limits  CBC    EKG None  Radiology CT ABDOMEN PELVIS W CONTRAST  Result Date: 04/28/2020 CLINICAL DATA:   Abdominal abscess/infection suspected. Low back and lower abdominal pain with bloating and nausea 3 days. EXAM: CT ABDOMEN AND PELVIS WITH CONTRAST TECHNIQUE: Multidetector CT imaging of the abdomen and pelvis was performed using the standard protocol following bolus administration of intravenous contrast. CONTRAST:  186mL OMNIPAQUE IOHEXOL 300 MG/ML  SOLN COMPARISON:  06/19/2017 FINDINGS: Lower chest: Lung bases are clear. Hepatobiliary: Previous cholecystectomy. Subcentimeter hypodensity over the left lobe of the liver too small to characterize but likely a cyst. Remainder of the liver as well as the biliary tree are normal. Pancreas: Normal. Spleen: Normal. Adrenals/Urinary Tract: Adrenal glands are normal. Kidneys are normal in size without hydronephrosis or focal mass. No evidence of nephrolithiasis. Ureters and bladder are normal. Stomach/Bowel: Stomach and small bowel are normal. Appendix is normal. Mild diverticulosis of the colon most prominent over the sigmoid colon. Vascular/Lymphatic: Abdominal aorta is normal caliber. Remaining vascular structures are unremarkable. No adenopathy. Reproductive: Previous hysterectomy. Other: Very small umbilical hernia containing only mesenteric fat unchanged. Moderate size midline ventral hernia below the umbilicus with fascial defect measuring 3.1 cm in diameter. This hernia contains only mesenteric fat. This is unchanged. There is no free fluid or focal inflammatory change in the abdomen/pelvis. Musculoskeletal: No focal abnormality. IMPRESSION: 1.  No acute findings in the abdomen/pelvis. 2. Moderate size midline ventral hernia below the umbilicus containing only mesenteric fat and unchanged. Small umbilical hernia containing only mesenteric fat unchanged. 3.  Mild colonic diverticulosis. 4. Subcentimeter hypodensity over the left lobe of the liver too small to characterize but likely a cyst. Electronically Signed   By: Marin Olp M.D.   On: 04/28/2020 10:32     Procedures Procedures (including critical care time)  Medications Ordered in ED Medications  iohexol (OMNIPAQUE) 300 MG/ML solution 100 mL (100 mLs Intravenous Contrast Given 04/28/20 1003)    ED Course  I have reviewed the triage vital signs  and the nursing notes.  Pertinent labs & imaging results that were available during my care of the patient were reviewed by me and considered in my medical decision making (see chart for details).    MDM Rules/Calculators/A&P    Patient with normal CT and mild elevated lipase.  She is placed on Vicodin will follow-up with her GI doctor.j          This patient presents to the ED for concern of back and abdominal pain, this involves an extensive number of treatment options, and is a complaint that carries with it a high risk of complications and morbidity.  The differential diagnosis includes kidney stone musculoskeletal   Lab Tests:   I Ordered, reviewed, and interpreted labs, which included CBC chemistries and urinalysis and lipase shows mild dehydration and mild elevated lipase  Medicines ordered:     Imaging Studies ordered:   I ordered imaging studies which included CT abdomen and  I independently visualized and interpreted imaging which showed small abdominal hernia  Additional history obtained:   Additional history obtained from records  Previous records obtained and reviewed.  Consultations Obtained:     Reevaluation:  After the interventions stated above, I reevaluated the patient and found mild improvement  Critical Interventions:  .                         Final Clinical Impression(s) / ED Diagnoses Final diagnoses:  Generalized abdominal pain    Rx / DC Orders ED Discharge Orders         Ordered    HYDROcodone-acetaminophen (NORCO/VICODIN) 5-325 MG tablet  Every 6 hours PRN     Discontinue  Reprint     04/28/20 1053           Milton Ferguson, MD 04/28/20 1108

## 2020-04-28 NOTE — ED Triage Notes (Signed)
Pt reports that she went to urgent care yesterday for left back pain and generalized abdominal pain. Pt states she had issues with her appendix in 2019 and took antibiotics as opposed to having removed  Pt reports nausea and bloated feeling

## 2020-04-28 NOTE — Discharge Instructions (Addendum)
Follow up with your gastroenterologist in the next couple weeks for recheck

## 2020-07-11 ENCOUNTER — Encounter: Payer: Self-pay | Admitting: Emergency Medicine

## 2020-07-11 ENCOUNTER — Ambulatory Visit
Admission: EM | Admit: 2020-07-11 | Discharge: 2020-07-11 | Disposition: A | Discharge status: Home / Self Care | Payer: 59 | Attending: Emergency Medicine | Admitting: Emergency Medicine

## 2020-07-11 DIAGNOSIS — J0100 Acute maxillary sinusitis, unspecified: Secondary | ICD-10-CM

## 2020-07-11 DIAGNOSIS — R059 Cough, unspecified: Secondary | ICD-10-CM | POA: Diagnosis not present

## 2020-07-11 MED ORDER — LEVOFLOXACIN 500 MG PO TABS
500.0000 mg | ORAL_TABLET | Freq: Every day | ORAL | 0 refills | Status: DC
Start: 2020-07-11 — End: 2023-02-09

## 2020-07-11 MED ORDER — BENZONATATE 100 MG PO CAPS
100.0000 mg | ORAL_CAPSULE | Freq: Three times a day (TID) | ORAL | 0 refills | Status: DC
Start: 2020-07-11 — End: 2023-02-09

## 2020-07-11 NOTE — Discharge Instructions (Signed)
Get plenty of rest and push fluids Will treat for sinus infection Levaquin prescribed. Take as directed and to completion Prescribed tessolone perles as needed for cough Begin with OTC zyrtec and flonase Follow up with PCP for recheck and/or if symptoms persists Return or go to ER if you have any new or worsening symptoms such as fever, chills, fatigue, shortness of breath, wheezing, chest pain, nausea, changes in bowel or bladder habits, etc..Marland Kitchen

## 2020-07-11 NOTE — ED Triage Notes (Addendum)
Cough for a couple of weeks. Had body aches last night. Neg pcr test on Friday

## 2020-07-11 NOTE — ED Provider Notes (Signed)
[place]   [id number] 07/11/20 Arrival Time: 0824  Cc: COUGH  SUBJECTIVE:  Lori Barber is a 54 y.o. female who presents with sinus congestion, sore throat, and clear productive cough x "few weeks."  Daughter and husband with similar symptoms.  Had negative COVID test last week.  Has tried OTC medications without relief.  Denies aggravating factors.  Reports previous symptoms in the past with allergies, but did not last this long.   Denies fever, chills, SOB, wheezing, chest pain, nausea, changes in bowel or bladder habits.    ROS: As per HPI.  All other pertinent ROS negative.     Past Medical History:  Diagnosis Date  . Abdominal pain, epigastric   . Allergy    mold,dust, animal dander  . Anal fissure   . Anemia   . Anxiety   . Esophageal reflux   . Esophageal stricture   . Esophagitis, unspecified   . Family history of malignant neoplasm of gastrointestinal tract   . Fatty liver   . Follicular cyst of ovary   . Hyperlipidemia   . Irritable bowel syndrome   . Obesity, unspecified   . Personal history of colonic polyps   . PONV (postoperative nausea and vomiting)   . Type II or unspecified type diabetes mellitus without mention of complication, not stated as uncontrolled    borderline, controlled by diet   . Unspecified gastritis and gastroduodenitis without mention of hemorrhage    Past Surgical History:  Procedure Laterality Date  . ABDOMINAL HYSTERECTOMY    . CESAREAN SECTION  1999  . CHOLECYSTECTOMY  2011  . COLONOSCOPY    . COLONOSCOPY N/A 04/03/2017   Procedure: COLONOSCOPY;  Surgeon: Irene Shipper, MD;  Location: [facility] ENDOSCOPY;  Service: Endoscopy;  Laterality: N/A;  . OVARIAN CYST REMOVAL  2002  . TONSILLECTOMY  1994  . UPPER GASTROINTESTINAL ENDOSCOPY     Allergies  Allergen Reactions  . Cleocin [Clindamycin Hcl] Anaphylaxis and Shortness Of Breath    Blood pressure dropping  . Penicillins Anaphylaxis and Swelling    Has patient had a PCN  reaction causing immediate rash, facial/tongue/throat swelling, SOB or lightheadedness with hypotension: Yes Has patient had a PCN reaction causing severe rash involving mucus membranes or skin necrosis:no Has patient had a PCN reaction that required hospitalization: yes Has patient had a PCN reaction occurring within the last 10 years:no If all of the above answers are "NO", then may proceed with Cephalosporin use.   . Mushroom Extract Complex Nausea And Vomiting and Other (See Comments)    Headache   . Amoxicillin Nausea Only  . Clarithromycin Nausea And Vomiting   No current facility-administered medications on file prior to encounter.   Current Outpatient Medications on File Prior to Encounter  Medication Sig Dispense Refill  . citalopram (CELEXA) 20 MG tablet Take 20 mg by mouth daily.    Marland Kitchen omeprazole (PRILOSEC) 20 MG capsule Take 20 mg by mouth as needed.    . simvastatin (ZOCOR) 20 MG tablet Take 20 mg by mouth daily.    . [DISCONTINUED] sertraline (ZOLOFT) 50 MG tablet Take 50 mg by mouth at bedtime.      Social History   Socioeconomic History  . Marital status: Married    Spouse name: Not on file  . Number of children: 1  . Years of education: Not on file  . Highest education level: Not on file  Occupational History  . Occupation: Facilities manager: [employer]  Tobacco Use  . Smoking status: Former Smoker    Packs/day: 0.50    Years: 26.00    Pack years: 13.00    Types: Cigarettes    Start date: 11/07/1978    Quit date: 06/15/2017    Years since quitting: 3.0  . Smokeless tobacco: Never Used  Vaping Use  . Vaping Use: Never used  Substance and Sexual Activity  . Alcohol use: No    Alcohol/week: 0.0 standard drinks    Comment: occasionally  . Drug use: No  . Sexual activity: Yes    Birth control/protection: None  Other Topics Concern  . Not on file  Social History Narrative   Patient is married with one daughter. She is Web designer  at [employer]. 2 Caffeinated beverages daily, no alcohol   07/10/2016   Social Determinants of Health   Financial Resource Strain:   . Difficulty of Paying Living Expenses: Not on file  Food Insecurity:   . Worried About Charity fundraiser in the Last Year: Not on file  . Ran Out of Food in the Last Year: Not on file  Transportation Needs:   . Lack of Transportation (Medical): Not on file  . Lack of Transportation (Non-Medical): Not on file  Physical Activity:   . Days of Exercise per Week: Not on file  . Minutes of Exercise per Session: Not on file  Stress:   . Feeling of Stress : Not on file  Social Connections:   . Frequency of Communication with Friends and Family: Not on file  . Frequency of Social Gatherings with Friends and Family: Not on file  . Attends Religious Services: Not on file  . Active Member of Clubs or Organizations: Not on file  . Attends Archivist Meetings: Not on file  . Marital Status: Not on file  Intimate Partner Violence:   . Fear of Current or Ex-Partner: Not on file  . Emotionally Abused: Not on file  . Physically Abused: Not on file  . Sexually Abused: Not on file   Family History  Problem Relation Age of Onset  . Colon cancer Maternal Aunt        cousin  . Cancer Maternal Aunt        colon  . Breast cancer Mother   . Cancer Mother        breast  . Other Mother        car accident  . Other Father        liver failure  . Cancer Cousin        colon  . Colon cancer Cousin   . Esophageal cancer Maternal Uncle   . Colon cancer Maternal Uncle   . Heart disease Paternal Grandmother   . Rectal cancer Neg Hx   . Stomach cancer Neg Hx      OBJECTIVE:  Vitals:   07/11/20 0853 07/11/20 0854 07/11/20 0857  BP:   122/86  Pulse: (!) 106    Resp: 18    Temp: 98 F (36.7 C)    TempSrc: Oral    SpO2: 96%    Weight:  232 lb (105.2 kg)   Height:  5\' 5"  (1.651 m)      General appearance: Alert, appears fatigued, but nontoxic;  speaking in full sentences without difficulty HEENT:NCAT; Ears: EACs clear, TMs pearly gray; Eyes: PERRL.  EOM grossly intact. Nose: nares patent without rhinorrhea; Sinuses:  TTP; Throat: tonsils nonerythematous or enlarged, uvula midline  Neck:  supple without LAD Lungs: clear to auscultation bilaterally without adventitious breath sounds; normal respiratory effort; mild cough present Heart: regular rate and rhythm.   Skin: warm and dry Psychological: alert and cooperative; normal mood and affect   ASSESSMENT & PLAN:  1. Cough   2. Acute non-recurrent maxillary sinusitis     Meds ordered this encounter  Medications  . benzonatate (TESSALON) 100 MG capsule    Sig: Take 1 capsule (100 mg total) by mouth every 8 (eight) hours.    Dispense:  21 capsule    Refill:  0    Order Specific Question:   Supervising Provider    Answer:   Raylene Everts [[id number]]  . levofloxacin (LEVAQUIN) 500 MG tablet    Sig: Take 1 tablet (500 mg total) by mouth daily.    Dispense:  5 tablet    Refill:  0    Order Specific Question:   Supervising Provider    Answer:   Raylene Everts [[id number]]   Get plenty of rest and push fluids Will treat for sinus infection Levaquin prescribed. Take as directed and to completion Prescribed tessolone perles as needed for cough Begin with OTC zyrtec and flonase Follow up with PCP for recheck and/or if symptoms persists Return or go to ER if you have any new or worsening symptoms such as fever, chills, fatigue, shortness of breath, wheezing, chest pain, nausea, changes in bowel or bladder habits, etc...  Reviewed expectations re: course of current medical issues. Questions answered. Outlined signs and symptoms indicating need for more acute intervention. Patient verbalized understanding. After Visit Summary given.          Lestine Box, PA-C 07/11/20 [id number]

## 2020-12-17 ENCOUNTER — Other Ambulatory Visit: Payer: Self-pay

## 2020-12-17 ENCOUNTER — Other Ambulatory Visit: Payer: Self-pay | Admitting: Internal Medicine

## 2020-12-17 ENCOUNTER — Ambulatory Visit
Admission: RE | Admit: 2020-12-17 | Discharge: 2020-12-17 | Disposition: A | Discharge status: Home / Self Care | Payer: 59 | Source: Ambulatory Visit | Attending: Internal Medicine | Admitting: Internal Medicine

## 2020-12-17 ENCOUNTER — Other Ambulatory Visit (HOSPITAL_COMMUNITY): Payer: Self-pay | Admitting: Internal Medicine

## 2020-12-17 DIAGNOSIS — R1084 Generalized abdominal pain: Secondary | ICD-10-CM

## 2020-12-17 DIAGNOSIS — R109 Unspecified abdominal pain: Secondary | ICD-10-CM | POA: Diagnosis present

## 2020-12-17 MED ORDER — IOHEXOL 300 MG/ML  SOLN
100.0000 mL | Freq: Once | INTRAMUSCULAR | Status: AC | PRN
  Administered 2020-12-17: 100 mL via INTRAVENOUS

## 2021-05-23 ENCOUNTER — Other Ambulatory Visit: Payer: Self-pay | Admitting: Obstetrics and Gynecology

## 2021-05-23 DIAGNOSIS — Z803 Family history of malignant neoplasm of breast: Secondary | ICD-10-CM

## 2021-06-04 DIAGNOSIS — Z803 Family history of malignant neoplasm of breast: Secondary | ICD-10-CM | POA: Insufficient documentation

## 2021-06-08 ENCOUNTER — Other Ambulatory Visit: Payer: Self-pay

## 2021-06-08 ENCOUNTER — Ambulatory Visit
Admission: RE | Admit: 2021-06-08 | Discharge: 2021-06-08 | Disposition: A | Discharge status: Home / Self Care | Payer: 59 | Source: Ambulatory Visit | Attending: Obstetrics and Gynecology | Admitting: Obstetrics and Gynecology

## 2021-06-08 DIAGNOSIS — Z803 Family history of malignant neoplasm of breast: Secondary | ICD-10-CM

## 2021-06-08 MED ORDER — GADOBUTROL 1 MMOL/ML IV SOLN
10.0000 mL | Freq: Once | INTRAVENOUS | Status: AC | PRN
  Administered 2021-06-08: 10 mL via INTRAVENOUS

## 2021-08-01 ENCOUNTER — Encounter: Payer: Self-pay | Admitting: Emergency Medicine

## 2021-08-01 ENCOUNTER — Ambulatory Visit
Admission: EM | Admit: 2021-08-01 | Discharge: 2021-08-01 | Disposition: A | Discharge status: Home / Self Care | Payer: 59 | Attending: Physician Assistant | Admitting: Physician Assistant

## 2021-08-01 ENCOUNTER — Other Ambulatory Visit: Payer: Self-pay

## 2021-08-01 DIAGNOSIS — L02219 Cutaneous abscess of trunk, unspecified: Secondary | ICD-10-CM | POA: Diagnosis not present

## 2021-08-01 DIAGNOSIS — L03319 Cellulitis of trunk, unspecified: Secondary | ICD-10-CM

## 2021-08-01 MED ORDER — DOXYCYCLINE HYCLATE 100 MG PO CAPS: 100.0000 mg | ORAL_CAPSULE | Freq: Two times a day (BID) | ORAL | 0 refills | Status: DC

## 2021-08-01 NOTE — Discharge Instructions (Signed)
Return if any problems.

## 2021-08-01 NOTE — ED Triage Notes (Signed)
Patient c/o abscess x 7 days.   Patient endorses abscess is present on LFT upper area of back.    Patient endorses increased pain and redness at site.   Patient has used warm compress and antibacterial ointment with no relief of symptoms.   History of similar problem on leg.

## 2021-08-02 DIAGNOSIS — L03319 Cellulitis of trunk, unspecified: Secondary | ICD-10-CM | POA: Diagnosis not present

## 2021-08-02 DIAGNOSIS — L02219 Cutaneous abscess of trunk, unspecified: Secondary | ICD-10-CM

## 2021-08-02 NOTE — ED Provider Notes (Signed)
[facility]    CSN: [id number] Arrival date & time: 08/01/21  0809      History   Chief Complaint Chief Complaint  Patient presents with   Abscess    HPI Lori Barber is a 55 y.o. female.   The history is provided by the patient. No language interpreter was used.  Abscess Location:  Torso Torso abscess location:  Upper back Size:  2 Abscess quality: painful, redness and warmth  Draining: cm. Red streaking: no   Progression:  Worsening Pain details:    Quality:  Aching   Progression:  Worsening Chronicity:  New Relieved by:  Nothing Worsened by:  Nothing Ineffective treatments:  None tried Associated symptoms: no vomiting    Past Medical History:  Diagnosis Date   Abdominal pain, epigastric    Allergy    mold,dust, animal dander   Anal fissure    Anemia    Anxiety    Esophageal reflux    Esophageal stricture    Esophagitis, unspecified    Family history of malignant neoplasm of gastrointestinal tract    Fatty liver    Follicular cyst of ovary    Hyperlipidemia    Irritable bowel syndrome    Obesity, unspecified    Personal history of colonic polyps    PONV (postoperative nausea and vomiting)    Type II or unspecified type diabetes mellitus without mention of complication, not stated as uncontrolled    borderline, controlled by diet    Unspecified gastritis and gastroduodenitis without mention of hemorrhage     Patient Active Problem List   Diagnosis Date Noted   Claudication ([place]) 10/28/2017   Hyperlipidemia 06/23/2017   Abnormal CT of the abdomen    Inflammation of colonic mucosa    RLQ abdominal pain 03/31/2017   Chest pain 07/24/2013   Diabetes mellitus type 2, diet-controlled ([place]) 07/24/2013   Palpitations 07/24/2013   Duodenitis 09/15/2011   Gastritis 09/15/2011   Nausea alone 09/15/2011   GERD with stricture 09/15/2011   Nausea 09/04/2011   Diarrhea following gastrointestinal surgery 09/04/2011   Hernia of unspecified  site of abdominal cavity without mention of obstruction or gangrene 09/04/2011   Obesity 09/04/2011   S/P cholecystectomy 09/04/2011   History of gastroesophageal reflux (GERD) 09/04/2011   Ventral hernia 07/11/2011   IBS (irritable bowel syndrome) 07/11/2011   GERD (gastroesophageal reflux disease) 07/11/2011   Fatty infiltration of liver 07/11/2011   Bilateral ovarian cysts 07/11/2011   Hx of cholecystectomy 07/11/2011   ABDOMINAL WALL HERNIA 10/12/2009   ARTHRALGIA 03/06/2009   ESOPHAGITIS 01/22/2009   GASTRITIS 12/08/2008   OBESITY, UNSPECIFIED 12/07/2008   ABDOMINAL PAIN-EPIGASTRIC 12/07/2008   HYPOTHYROIDISM 11/09/2008   HEMORRHOIDS 11/09/2008   GERD 11/09/2008   IBS 11/09/2008   FATTY LIVER DISEASE 11/09/2008   OVARIAN CYST 11/09/2008   COLONIC POLYPS, ADENOMATOUS, HX OF 11/09/2008    Past Surgical History:  Procedure Laterality Date   ABDOMINAL HYSTERECTOMY     CESAREAN SECTION  1999   CHOLECYSTECTOMY  2011   COLONOSCOPY     COLONOSCOPY N/A 04/03/2017   Procedure: COLONOSCOPY;  Surgeon: Irene Shipper, MD;  Location: [facility] ENDOSCOPY;  Service: Endoscopy;  Laterality: N/A;   OVARIAN CYST REMOVAL  2002   TONSILLECTOMY  1994   UPPER GASTROINTESTINAL ENDOSCOPY      OB History   No obstetric history on file.      Home Medications    Prior to Admission medications   Medication Sig Start Date End  Date Taking? Authorizing Provider  doxycycline (VIBRAMYCIN) 100 MG capsule Take 1 capsule (100 mg total) by mouth 2 (two) times daily. 08/01/21  Yes Caryl Ada K, PA-C  benzonatate (TESSALON) 100 MG capsule Take 1 capsule (100 mg total) by mouth every 8 (eight) hours. 07/11/20   Wurst, [place], PA-C  citalopram (CELEXA) 20 MG tablet Take 20 mg by mouth daily.    [provider]  levofloxacin (LEVAQUIN) 500 MG tablet Take 1 tablet (500 mg total) by mouth daily. 07/11/20   Wurst, [place], PA-C  omeprazole (PRILOSEC) 20 MG capsule Take 20 mg by mouth as needed.     [provider]  simvastatin (ZOCOR) 20 MG tablet Take 20 mg by mouth daily. 06/16/17 06/16/18  [provider]  sertraline (ZOLOFT) 50 MG tablet Take 50 mg by mouth at bedtime. 06/16/17 04/27/20  [provider]    Family History Family History  Problem Relation Age of Onset   Colon cancer Maternal Aunt        cousin   Cancer Maternal Aunt        colon   Breast cancer Mother    Cancer Mother        breast   Other Mother        car accident   Other Father        liver failure   Cancer Cousin        colon   Colon cancer Cousin    Esophageal cancer Maternal Uncle    Colon cancer Maternal Uncle    Heart disease Paternal Grandmother    Rectal cancer Neg Hx    Stomach cancer Neg Hx     Social History Social History   Tobacco Use   Smoking status: Former    Packs/day: 0.50    Years: 26.00    Pack years: 13.00    Types: Cigarettes    Start date: 11/07/1978    Quit date: 06/15/2017    Years since quitting: 4.1   Smokeless tobacco: Never  Vaping Use   Vaping Use: Never used  Substance Use Topics   Alcohol use: No    Alcohol/week: 0.0 standard drinks    Comment: occasionally   Drug use: No     Allergies   Cleocin [clindamycin hcl], Penicillins, Mushroom extract complex, Amoxicillin, and Clarithromycin   Review of Systems Review of Systems  Gastrointestinal:  Negative for vomiting.  All other systems reviewed and are negative.   Physical Exam Triage Vital Signs ED Triage Vitals [08/01/21 0823]  Enc Vitals Group     BP 111/77     Pulse Rate 82     Resp 16     Temp 97.9 F (36.6 C)     Temp Source Oral     SpO2 97 %     Weight      Height      Head Circumference      Peak Flow      Pain Score 8     Pain Loc      Pain Edu?      Excl. in [place]?    No data found.  Updated Vital Signs BP 111/77 (BP Location: Right Arm)   Pulse 82   Temp 97.9 F (36.6 C) (Oral)   Resp 16   SpO2 97%   Visual Acuity Right Eye Distance:   Left  Eye Distance:   Bilateral Distance:    Right Eye Near:   Left Eye Near:  Bilateral Near:     Physical Exam Vitals reviewed.  Constitutional:      Appearance: Normal appearance.  Musculoskeletal:        General: Normal range of motion.  Skin:    Findings: Erythema present.     Comments: red swollen area right upper back  Neurological:     General: No focal deficit present.     Mental Status: She is alert.  Psychiatric:        Mood and Affect: Mood normal.     UC Treatments / Results  Labs (all labs ordered are listed, but only abnormal results are displayed) Labs Reviewed - No data to display  EKG   Radiology No results found.  Procedures Incision and Drainage  Date/Time: 08/02/2021 3:20 PM Performed by: Fransico Meadow, PA-C Authorized by: Fransico Meadow, PA-C   Consent:    Consent obtained:  Verbal   Consent given by:  Patient   Risks, benefits, and alternatives were discussed: yes     Risks discussed:  Bleeding   Alternatives discussed:  No treatment Universal protocol:    Procedure explained and questions answered to patient or proxy's satisfaction: yes     Patient identity confirmed:  Verbally with patient Location:    Type:  Abscess   Size:  Back   Location:  Trunk   Trunk location:  Back Pre-procedure details:    Skin preparation:  Chlorhexidine Anesthesia:    Anesthesia method:  Local infiltration   Local anesthetic:  Lidocaine 1% w/o epi Procedure details:    Incision types:  Single straight   Incision depth:  Submucosal   Wound management:  Irrigated with saline   Wound treatment:  Wound left open Post-procedure details:    Procedure completion:  Tolerated (including critical care time)  Medications Ordered in UC Medications - No data to display  Initial Impression / Assessment and Plan / UC Course  I have reviewed the triage vital signs and the nursing notes.  Pertinent labs & imaging results that were available during my care of  the patient were reviewed by me and considered in my medical decision making (see chart for details).     MDM:  Pt given rx for doxycycline  Final Clinical Impressions(s) / UC Diagnoses   Final diagnoses:  Cellulitis and abscess of trunk     Discharge Instructions      Return if any problems.    ED Prescriptions     Medication Sig Dispense Auth. Provider   doxycycline (VIBRAMYCIN) 100 MG capsule Take 1 capsule (100 mg total) by mouth 2 (two) times daily. 20 capsule Fransico Meadow, [place]      PDMP not reviewed this encounter. An After Visit Summary was printed and given to the patient.    Fransico Meadow, [place] 08/02/21 [phone]

## 2021-10-19 ENCOUNTER — Ambulatory Visit
Admission: RE | Admit: 2021-10-19 | Discharge: 2021-10-19 | Disposition: A | Discharge status: Home / Self Care | Payer: 59 | Source: Ambulatory Visit | Attending: Family Medicine | Admitting: Family Medicine

## 2021-10-19 ENCOUNTER — Other Ambulatory Visit: Payer: Self-pay

## 2021-10-19 VITALS — BP 105/73 | HR 84 | Temp 97.8°F | Resp 18 | Ht 65.0 in | Wt 245.0 lb

## 2021-10-19 DIAGNOSIS — R52 Pain, unspecified: Secondary | ICD-10-CM | POA: Diagnosis not present

## 2021-10-19 DIAGNOSIS — J069 Acute upper respiratory infection, unspecified: Secondary | ICD-10-CM | POA: Diagnosis not present

## 2021-10-19 DIAGNOSIS — Z1152 Encounter for screening for COVID-19: Secondary | ICD-10-CM

## 2021-10-19 MED ORDER — PROMETHAZINE-DM 6.25-15 MG/5ML PO SYRP: 5.0000 mL | ORAL_SOLUTION | Freq: Four times a day (QID) | ORAL | 0 refills | Status: DC | PRN

## 2021-10-19 MED ORDER — ALBUTEROL SULFATE HFA 108 (90 BASE) MCG/ACT IN AERS: 2.0000 | INHALATION_SPRAY | Freq: Four times a day (QID) | RESPIRATORY_TRACT | 0 refills | Status: DC | PRN

## 2021-10-19 NOTE — ED Triage Notes (Signed)
Pt reports headache, fatigue, intermittent chest discomfort that comes and goes for last week. Pt also reports neck stiffness for last several days and sore throat that started this morning.

## 2021-10-19 NOTE — ED Provider Notes (Signed)
[facility]    CSN: [id number] Arrival date & time: 10/19/21  0846      History   Chief Complaint Chief Complaint  Patient presents with   Headache    HPI Lori Barber is a 56 y.o. female.   Patient presenting today with about a week of waxing and waning headache, fatigue, chest tightness, cough, sore throat, generalized body aches, possibly low-grade fevers.  Denies chest pain, shortness of breath, abdominal pain, nausea vomiting or diarrhea.  So far taking Tylenol and ibuprofen with mild temporary relief of the body aches.  Tested for COVID twice since onset of symptoms and both times have been negative.   Past Medical History:  Diagnosis Date   Abdominal pain, epigastric    Allergy    mold,dust, animal dander   Anal fissure    Anemia    Anxiety    Esophageal reflux    Esophageal stricture    Esophagitis, unspecified    Family history of malignant neoplasm of gastrointestinal tract    Fatty liver    Follicular cyst of ovary    Hyperlipidemia    Irritable bowel syndrome    Obesity, unspecified    Personal history of colonic polyps    PONV (postoperative nausea and vomiting)    Type II or unspecified type diabetes mellitus without mention of complication, not stated as uncontrolled    borderline, controlled by diet    Unspecified gastritis and gastroduodenitis without mention of hemorrhage     Patient Active Problem List   Diagnosis Date Noted   Claudication ([place]) 10/28/2017   Hyperlipidemia 06/23/2017   Abnormal CT of the abdomen    Inflammation of colonic mucosa    RLQ abdominal pain 03/31/2017   Chest pain 07/24/2013   Diabetes mellitus type 2, diet-controlled ([place]) 07/24/2013   Palpitations 07/24/2013   Duodenitis 09/15/2011   Gastritis 09/15/2011   Nausea alone 09/15/2011   GERD with stricture 09/15/2011   Nausea 09/04/2011   Diarrhea following gastrointestinal surgery 09/04/2011   Hernia of unspecified site of abdominal cavity  without mention of obstruction or gangrene 09/04/2011   Obesity 09/04/2011   S/P cholecystectomy 09/04/2011   History of gastroesophageal reflux (GERD) 09/04/2011   Ventral hernia 07/11/2011   IBS (irritable bowel syndrome) 07/11/2011   GERD (gastroesophageal reflux disease) 07/11/2011   Fatty infiltration of liver 07/11/2011   Bilateral ovarian cysts 07/11/2011   Hx of cholecystectomy 07/11/2011   ABDOMINAL WALL HERNIA 10/12/2009   ARTHRALGIA 03/06/2009   ESOPHAGITIS 01/22/2009   GASTRITIS 12/08/2008   OBESITY, UNSPECIFIED 12/07/2008   ABDOMINAL PAIN-EPIGASTRIC 12/07/2008   HYPOTHYROIDISM 11/09/2008   HEMORRHOIDS 11/09/2008   GERD 11/09/2008   IBS 11/09/2008   FATTY LIVER DISEASE 11/09/2008   OVARIAN CYST 11/09/2008   COLONIC POLYPS, ADENOMATOUS, HX OF 11/09/2008    Past Surgical History:  Procedure Laterality Date   ABDOMINAL HYSTERECTOMY     CESAREAN SECTION  1999   CHOLECYSTECTOMY  2011   COLONOSCOPY     COLONOSCOPY N/A 04/03/2017   Procedure: COLONOSCOPY;  Surgeon: Irene Shipper, MD;  Location: [facility] END[facility]COPY;  Service: Endoscopy;  Laterality: N/A;   OVARIAN CYST REMOVAL  2002   TONSILLECTOMY  1994   UPPER GASTROINTESTINAL ENDOSCOPY      OB History   No obstetric history on file.      Home Medications    Prior to Admission medications   Medication Sig Start Date End Date Taking? Authorizing Provider  albuterol (VENTOLIN HFA) 108 (90  Base) MCG/ACT inhaler Inhale 2 puffs into the lungs every 6 (six) hours as needed for wheezing or shortness of breath. 10/19/21  Yes Volney American, PA-C  promethazine-dextromethorphan (PROMETHAZINE-DM) 6.25-15 MG/5ML syrup Take 5 mLs by mouth 4 (four) times daily as needed. 10/19/21  Yes Volney American, PA-C  benzonatate (TESSALON) 100 MG capsule Take 1 capsule (100 mg total) by mouth every 8 (eight) hours. 07/11/20   Wurst, [place], PA-C  citalopram (CELEXA) 20 MG tablet Take 20 mg by mouth daily.    [provider]  doxycycline (VIBRAMYCIN) 100 MG capsule Take 1 capsule (100 mg total) by mouth 2 (two) times daily. 08/01/21   Fransico Meadow, PA-C  levofloxacin (LEVAQUIN) 500 MG tablet Take 1 tablet (500 mg total) by mouth daily. 07/11/20   Wurst, [place], PA-C  omeprazole (PRILOSEC) 20 MG capsule Take 20 mg by mouth as needed.    [provider]  simvastatin (ZOCOR) 20 MG tablet Take 20 mg by mouth daily. 06/16/17 06/16/18  [provider]  sertraline (ZOLOFT) 50 MG tablet Take 50 mg by mouth at bedtime. 06/16/17 04/27/20  [provider]    Family History Family History  Problem Relation Age of Onset   Colon cancer Maternal Aunt        cousin   Cancer Maternal Aunt        colon   Breast cancer Mother    Cancer Mother        breast   Other Mother        car accident   Other Father        liver failure   Cancer Cousin        colon   Colon cancer Cousin    Esophageal cancer Maternal Uncle    Colon cancer Maternal Uncle    Heart disease Paternal Grandmother    Rectal cancer Neg Hx    Stomach cancer Neg Hx     Social History Social History   Tobacco Use   Smoking status: Former    Packs/day: 0.50    Years: 26.00    Pack years: 13.00    Types: Cigarettes    Start date: 11/07/1978    Quit date: 06/15/2017    Years since quitting: 4.3   Smokeless tobacco: Never  Vaping Use   Vaping Use: Never used  Substance Use Topics   Alcohol use: No    Alcohol/week: 0.0 standard drinks    Comment: occasionally   Drug use: No    Allergies   Cleocin [clindamycin hcl], Penicillins, Mushroom extract complex, Amoxicillin, and Clarithromycin  Review of Systems Review of Systems Per HPI  Physical Exam Triage Vital Signs ED Triage Vitals [10/19/21 0917]  Enc Vitals Group     BP 105/73     Pulse Rate 84     Resp 18     Temp 97.8 F (36.6 C)     Temp Source Oral     SpO2 98 %     Weight 245 lb (111.1 kg)     Height 5\' 5"  (1.651 m)     Head  Circumference      Peak Flow      Pain Score 6     Pain Loc      Pain Edu?      Excl. in [place]?    No data found.  Updated Vital Signs BP 105/73 (BP Location: Right Arm)    Pulse 84    Temp 97.8 F (  36.6 C) (Oral)    Resp 18    Ht 5\' 5"  (1.651 m)    Wt 245 lb (111.1 kg)    SpO2 98%    BMI 40.77 kg/m   Visual Acuity Right Eye Distance:   Left Eye Distance:   Bilateral Distance:    Right Eye Near:   Left Eye Near:    Bilateral Near:     Physical Exam Vitals and nursing note reviewed.  Constitutional:      Appearance: Normal appearance. She is not ill-appearing.  HENT:     Head: Atraumatic.     Right Ear: Tympanic membrane normal.     Left Ear: Tympanic membrane normal.     Nose: Nose normal.     Mouth/Throat:     Mouth: Mucous membranes are moist.     Pharynx: Oropharynx is clear. Posterior oropharyngeal erythema present.  Eyes:     Extraocular Movements: Extraocular movements intact.     Conjunctiva/sclera: Conjunctivae normal.  Cardiovascular:     Rate and Rhythm: Normal rate and regular rhythm.     Heart sounds: Normal heart sounds.  Pulmonary:     Effort: Pulmonary effort is normal.     Breath sounds: Normal breath sounds. No wheezing or rales.  Musculoskeletal:        General: Normal range of motion.     Cervical back: Normal range of motion and neck supple.  Skin:    General: Skin is warm and dry.  Neurological:     Mental Status: She is alert and oriented to person, place, and time.     Motor: No weakness.     Gait: Gait normal.  Psychiatric:        Mood and Affect: Mood normal.        Thought Content: Thought content normal.        Judgment: Judgment normal.     UC Treatments / Results  Labs (all labs ordered are listed, but only abnormal results are displayed) Labs Reviewed  COVID-19, FLU A+B NAA    EKG   Radiology No results found.  Procedures Procedures (including critical care time)  Medications Ordered in UC Medications - No data to  display  Initial Impression / Assessment and Plan / UC Course  I have reviewed the triage vital signs and the nursing notes.  Pertinent labs & imaging results that were available during my care of the patient were reviewed by me and considered in my medical decision making (see chart for details).     Vital signs benign and reassuring, COVID and flu testing pending.  Suspect viral illness causing symptoms.  Exam overall very reassuring today but given her occasional feelings of inflammation in her chest will give albuterol inhaler, Phenergan DM to resolve cough and congestion.  Discussed supportive over-the-counter medications and home care.  Return for acutely worsening symptoms.  Final Clinical Impressions(s) / UC Diagnoses   Final diagnoses:  Encounter for screening for COVID-19  Viral URI with cough  Generalized body aches   Discharge Instructions   None    ED Prescriptions     Medication Sig Dispense Auth. Provider   albuterol (VENTOLIN HFA) 108 (90 Base) MCG/ACT inhaler Inhale 2 puffs into the lungs every 6 (six) hours as needed for wheezing or shortness of breath. [place], [place]   promethazine-dextromethorphan (PROMETHAZINE-DM) 6.25-15 MG/5ML syrup Take 5 mLs by mouth 4 (four) times daily as needed. 100 mL Volney American, [place]  PDMP not reviewed this encounter.   Merrie Roof [place], [place] 10/19/21 [phone]

## 2021-10-20 LAB — COVID-19, FLU A+B NAA
Influenza A, NAA: NOT DETECTED
Influenza B, NAA: NOT DETECTED
SARS-CoV-2, NAA: NOT DETECTED

## 2022-07-28 ENCOUNTER — Ambulatory Visit
Admission: EM | Admit: 2022-07-28 | Discharge: 2022-07-28 | Disposition: A | Discharge status: Home / Self Care | Payer: 59 | Attending: Nurse Practitioner | Admitting: Nurse Practitioner

## 2022-07-28 DIAGNOSIS — L03319 Cellulitis of trunk, unspecified: Secondary | ICD-10-CM

## 2022-07-28 MED ORDER — DOXYCYCLINE HYCLATE 100 MG PO TABS: 100.0000 mg | ORAL_TABLET | Freq: Two times a day (BID) | ORAL | 0 refills | Status: DC

## 2022-07-28 NOTE — ED Triage Notes (Signed)
Pt reports bump the back x 3 days. Reports she put antibiotic a  dressing with antibiotic ointment last night and is bothering her.

## 2022-07-28 NOTE — ED Provider Notes (Signed)
[facility]    CSN: [id number] Arrival date & time: 07/28/22  0807      History   Chief Complaint Chief Complaint  Patient presents with   Abscess    HPI Lori Barber is a 56 y.o. female.   The history is provided by the patient.   Patient presents for complaints of a bump to the left side of her mid back.  She states symptoms started approximately 3 days ago.  She states the area started off as itchy, but that has since progressed to an area that has grown in size and become more painful.  She states that she woke up this morning and she was feeling "not well".  She denies fever, chills, chest pain, abdominal pain, nausea, vomiting, or diarrhea.  She states that she was previously diagnosed with something called "at bedtime" by her gynecologist.  She states that she has had previous or similar symptoms and was started on doxycycline which helped her symptoms.  She states that she has had her husband put antibiotic on the area and covered with a Band-Aid.  She states the area has not drained since it started.  Past Medical History:  Diagnosis Date   Abdominal pain, epigastric    Allergy    mold,dust, animal dander   Anal fissure    Anemia    Anxiety    Esophageal reflux    Esophageal stricture    Esophagitis, unspecified    Family history of malignant neoplasm of gastrointestinal tract    Fatty liver    Follicular cyst of ovary    Hyperlipidemia    Irritable bowel syndrome    Obesity, unspecified    Personal history of colonic polyps    PONV (postoperative nausea and vomiting)    Type II or unspecified type diabetes mellitus without mention of complication, not stated as uncontrolled    borderline, controlled by diet    Unspecified gastritis and gastroduodenitis without mention of hemorrhage     Patient Active Problem List   Diagnosis Date Noted   Claudication ([place]) 10/28/2017   Hyperlipidemia 06/23/2017   Abnormal CT of the abdomen    Inflammation  of colonic mucosa    RLQ abdominal pain 03/31/2017   Chest pain 07/24/2013   Diabetes mellitus type 2, diet-controlled ([place]) 07/24/2013   Palpitations 07/24/2013   Duodenitis 09/15/2011   Gastritis 09/15/2011   Nausea alone 09/15/2011   GERD with stricture 09/15/2011   Nausea 09/04/2011   Diarrhea following gastrointestinal surgery 09/04/2011   Hernia of unspecified site of abdominal cavity without mention of obstruction or gangrene 09/04/2011   Obesity 09/04/2011   S/P cholecystectomy 09/04/2011   History of gastroesophageal reflux (GERD) 09/04/2011   Ventral hernia 07/11/2011   IBS (irritable bowel syndrome) 07/11/2011   GERD (gastroesophageal reflux disease) 07/11/2011   Fatty infiltration of liver 07/11/2011   Bilateral ovarian cysts 07/11/2011   Hx of cholecystectomy 07/11/2011   ABDOMINAL WALL HERNIA 10/12/2009   ARTHRALGIA 03/06/2009   ESOPHAGITIS 01/22/2009   GASTRITIS 12/08/2008   OBESITY, UNSPECIFIED 12/07/2008   ABDOMINAL PAIN-EPIGASTRIC 12/07/2008   HYPOTHYROIDISM 11/09/2008   HEMORRHOIDS 11/09/2008   GERD 11/09/2008   IBS 11/09/2008   FATTY LIVER DISEASE 11/09/2008   OVARIAN CYST 11/09/2008   COLONIC POLYPS, ADENOMATOUS, HX OF 11/09/2008    Past Surgical History:  Procedure Laterality Date   ABDOMINAL HYSTERECTOMY     CESAREAN SECTION  1999   CHOLECYSTECTOMY  2011   COLONOSCOPY  COLONOSCOPY N/A 04/03/2017   Procedure: COLONOSCOPY;  Surgeon: Irene Shipper, MD;  Location: [facility] ENDOSCOPY;  Service: Endoscopy;  Laterality: N/A;   OVARIAN CYST REMOVAL  2002   TONSILLECTOMY  1994   UPPER GASTROINTESTINAL ENDOSCOPY      OB History   No obstetric history on file.      Home Medications    Prior to Admission medications   Medication Sig Start Date End Date Taking? Authorizing Provider  doxycycline (VIBRA-TABS) 100 MG tablet Take 1 tablet (100 mg total) by mouth 2 (two) times daily. 07/28/22  Yes Kaedance Magos-Warren, Alda Lea, NP  albuterol (VENTOLIN HFA) 108  (90 Base) MCG/ACT inhaler Inhale 2 puffs into the lungs every 6 (six) hours as needed for wheezing or shortness of breath. 10/19/21   Volney American, PA-C  benzonatate (TESSALON) 100 MG capsule Take 1 capsule (100 mg total) by mouth every 8 (eight) hours. 07/11/20   Wurst, [place], PA-C  citalopram (CELEXA) 20 MG tablet Take 20 mg by mouth daily.    [provider]  levofloxacin (LEVAQUIN) 500 MG tablet Take 1 tablet (500 mg total) by mouth daily. 07/11/20   Wurst, [place], PA-C  omeprazole (PRILOSEC) 20 MG capsule Take 20 mg by mouth as needed.    [provider]  promethazine-dextromethorphan (PROMETHAZINE-DM) 6.25-15 MG/5ML syrup Take 5 mLs by mouth 4 (four) times daily as needed. 10/19/21   Volney American, PA-C  simvastatin (ZOCOR) 20 MG tablet Take 20 mg by mouth daily. 06/16/17 06/16/18  [provider]  sertraline (ZOLOFT) 50 MG tablet Take 50 mg by mouth at bedtime. 06/16/17 04/27/20  [provider]    Family History Family History  Problem Relation Age of Onset   Colon cancer Maternal Aunt        cousin   Cancer Maternal Aunt        colon   Breast cancer Mother    Cancer Mother        breast   Other Mother        car accident   Other Father        liver failure   Cancer Cousin        colon   Colon cancer Cousin    Esophageal cancer Maternal Uncle    Colon cancer Maternal Uncle    Heart disease Paternal Grandmother    Rectal cancer Neg Hx    Stomach cancer Neg Hx     Social History Social History   Tobacco Use   Smoking status: Former    Packs/day: 0.50    Years: 26.00    Total pack years: 13.00    Types: Cigarettes    Start date: 11/07/1978    Quit date: 06/15/2017    Years since quitting: 5.1   Smokeless tobacco: Never  Vaping Use   Vaping Use: Never used  Substance Use Topics   Alcohol use: No    Alcohol/week: 0.0 standard drinks of alcohol    Comment: occasionally   Drug use: No     Allergies   Cleocin  [clindamycin hcl], Penicillins, Mushroom extract complex, Amoxicillin, and Clarithromycin   Review of Systems Review of Systems Per HPI  Physical Exam Triage Vital Signs ED Triage Vitals  Enc Vitals Group     BP 07/28/22 0816 120/83     Pulse Rate 07/28/22 0816 74     Resp 07/28/22 0816 19     Temp 07/28/22 0816 97.8 F (36.6 C)     Temp  Source 07/28/22 0816 Oral     SpO2 07/28/22 0816 97 %     Weight --      Height --      Head Circumference --      Peak Flow --      Pain Score 07/28/22 0818 9     Pain Loc --      Pain Edu? --      Excl. in [place]? --    No data found.  Updated Vital Signs BP 120/83 (BP Location: Right Arm)   Pulse 74   Temp 97.8 F (36.6 C) (Oral)   Resp 19   SpO2 97%   Visual Acuity Right Eye Distance:   Left Eye Distance:   Bilateral Distance:    Right Eye Near:   Left Eye Near:    Bilateral Near:     Physical Exam Vitals and nursing note reviewed.  Constitutional:      Appearance: Normal appearance. She is not toxic-appearing.  HENT:     Head: Normocephalic.  Cardiovascular:     Rate and Rhythm: Normal rate and regular rhythm.     Pulses: Normal pulses.     Heart sounds: Normal heart sounds.  Pulmonary:     Effort: Pulmonary effort is normal.     Breath sounds: Normal breath sounds.  Abdominal:     General: Bowel sounds are normal.     Palpations: Abdomen is soft.  Musculoskeletal:     Cervical back: Normal range of motion.  Skin:    General: Skin is warm and dry.     Comments: Induration noted to the left outer portion of the mid back.  Area is erythematous, and firm, area is raised in the core.  Area is tender to palpation.  There is no fluctuance, oozing, or drainage present.  Area measures approximately 4 cm in diameter.  Neurological:     General: No focal deficit present.     Mental Status: She is alert and oriented to person, place, and time.  Psychiatric:        Mood and Affect: Mood normal.        Behavior: Behavior  normal.      UC Treatments / Results  Labs (all labs ordered are listed, but only abnormal results are displayed) Labs Reviewed - No data to display  EKG   Radiology No results found.  Procedures Procedures (including critical care time)  Medications Ordered in UC Medications - No data to display  Initial Impression / Assessment and Plan / UC Course  I have reviewed the triage vital signs and the nursing notes.  Pertinent labs & imaging results that were available during my care of the patient were reviewed by me and considered in my medical decision making (see chart for details).  Induration noted to the left mid back consistent with cellulitis.  Area is erythematous and tender to palpation.  There is no oozing, fluctuance, or drainage present at this time.  We will treat patient with doxycycline 100 mg.  Supportive care recommendations were provided to the patient along with strict return precautions.  Patient verbalizes understanding.  All questions were answered.  Patient is stable for discharge. Final Clinical Impressions(s) / UC Diagnoses   Final diagnoses:  Cellulitis of trunk, unspecified site of trunk     Discharge Instructions      Take medication as prescribed. Warm compresses to the affected area 3-4 times daily. Clean the area at least twice daily with Dial Gold  bar soap or another type of antibacterial soap. Keep the area covered while it is draining. Follow-up in this clinic if you continue to experience pain is, increased swelling, or the area begins to look like a pimple. Go to the emergency department if you develop fever, chills, generalized fatigue, nausea, vomiting, or if the area of redness spreads, or if you have foul-smelling drainage.  Follow-up as needed.     ED Prescriptions     Medication Sig Dispense Auth. Provider   doxycycline (VIBRA-TABS) 100 MG tablet Take 1 tablet (100 mg total) by mouth 2 (two) times daily. 14 tablet  Malie Kashani-Warren, Alda Lea, NP      PDMP not reviewed this encounter.   Tish Men, NP 07/28/22 [phone]

## 2022-07-28 NOTE — Discharge Instructions (Addendum)
Take medication as prescribed. Warm compresses to the affected area 3-4 times daily. Clean the area at least twice daily with Dial Gold bar soap or another type of antibacterial soap. Keep the area covered while it is draining. Follow-up in this clinic if you continue to experience pain is, increased swelling, or the area begins to look like a pimple. Go to the emergency department if you develop fever, chills, generalized fatigue, nausea, vomiting, or if the area of redness spreads, or if you have foul-smelling drainage.  Follow-up as needed.

## 2022-07-29 ENCOUNTER — Encounter: Payer: Self-pay | Admitting: Internal Medicine

## 2022-12-22 ENCOUNTER — Encounter: Payer: Self-pay | Admitting: Internal Medicine

## 2023-02-09 ENCOUNTER — Ambulatory Visit (AMBULATORY_SURGERY_CENTER): Payer: 59 | Admitting: *Deleted

## 2023-02-09 VITALS — Ht 63.5 in | Wt 240.0 lb

## 2023-02-09 DIAGNOSIS — Z86010 Personal history of colonic polyps: Secondary | ICD-10-CM

## 2023-02-09 MED ORDER — NA SULFATE-K SULFATE-MG SULF 17.5-3.13-1.6 GM/177ML PO SOLN: 1.0000 | Freq: Once | ORAL | 0 refills | Status: AC

## 2023-02-09 NOTE — Progress Notes (Signed)
Pt's name and DOB verified at the beginning of the pre-visit.  Pt denies any difficulty with ambulating,sitting, laying down or rolling side to side Gave both [facility] main # and MD on call # prior to instructions.  No egg or soy allergy known to patient  No issues known to pt with past sedation with any surgeries or procedures Pt denies having issues being intubated Pt has no issues moving head neck or swallowing No FH of Malignant Hyperthermia Pt is not on diet pills Pt is not on home 02  Pt is not on blood thinners  Pt denies issues with constipation  Pt is not on dialysis Pt denies any upcoming cardiac testing Pt encouraged to use to use Singlecare or Goodrx to reduce cost  Patient's chart reviewed by Cathlyn Parsons CNRA prior to pre-visit and patient appropriate for the [facility].  Pre-visit completed and red dot placed by patient's name on their procedure day (on provider's schedule).  . Visit by phone Pt states weight is 240lb Instructed pt why it is important to and  to call if they have any changes in health or new medications. Directed them to the # given and on instructions.   Pt states they will.  Instructions reviewed with pt and pt states understanding. Instructed to review again prior to procedure. Pt states they will.  Instructions sent by mail with coupon and by my chart

## 2023-02-17 ENCOUNTER — Encounter: Payer: Self-pay | Admitting: Internal Medicine

## 2023-03-09 ENCOUNTER — Encounter: Payer: Self-pay | Admitting: Internal Medicine

## 2023-03-09 ENCOUNTER — Ambulatory Visit (AMBULATORY_SURGERY_CENTER): Payer: 59 | Admitting: Internal Medicine

## 2023-03-09 VITALS — BP 101/63 | HR 63 | Temp 97.3°F | Resp 7 | Ht 63.5 in | Wt 240.0 lb

## 2023-03-09 DIAGNOSIS — Z09 Encounter for follow-up examination after completed treatment for conditions other than malignant neoplasm: Secondary | ICD-10-CM | POA: Diagnosis present

## 2023-03-09 DIAGNOSIS — K6389 Other specified diseases of intestine: Secondary | ICD-10-CM

## 2023-03-09 DIAGNOSIS — D123 Benign neoplasm of transverse colon: Secondary | ICD-10-CM

## 2023-03-09 DIAGNOSIS — Z86010 Personal history of colonic polyps: Secondary | ICD-10-CM | POA: Diagnosis not present

## 2023-03-09 MED ORDER — SODIUM CHLORIDE 0.9 % IV SOLN: 500.0000 mL | Freq: Once | INTRAVENOUS | Status: DC

## 2023-03-09 NOTE — Progress Notes (Signed)
Pt's states no medical or surgical changes since previsit or office visit. 

## 2023-03-09 NOTE — Op Note (Signed)
[facility] Patient Name: Lori Barber Procedure Date: 03/09/2023 1:51 PM MRN: [id number] Endoscopist: Wilhemina Bonito. Marina Goodell , MD, [id number] Age: 57 Referring MD:  Date of Birth: [birth year] Gender: Female Account #: [id number] Procedure:                Colonoscopy with cold snare polypectomy x 1 Indications:              Screening for colon cancer: Family history of                            colorectal cancer in multiple 2nd degree relatives.                            Reported history of polyps. Previous examinations                            with multiple providers 2000, 2005, 2008, 2013, 2018 Medicines:                Monitored Anesthesia Care Procedure:                Pre-Anesthesia Assessment:                           - Prior to the procedure, a History and Physical                            was performed, and patient medications and                            allergies were reviewed. The patient's tolerance of                            previous anesthesia was also reviewed. The risks                            and benefits of the procedure and the sedation                            options and risks were discussed with the patient.                            All questions were answered, and informed consent                            was obtained. Prior Anticoagulants: The patient has                            taken no anticoagulant or antiplatelet agents. ASA                            Grade Assessment: II - A patient with mild systemic                            disease. After reviewing the risks and benefits,  the patient was deemed in satisfactory condition to                            undergo the procedure.                           After obtaining informed consent, the colonoscope                            was passed under direct vision. Throughout the                            procedure, the patient's blood pressure, pulse, and                             oxygen saturations were monitored continuously. The                            Olympus CF-HQ190L ([id number]) Colonoscope was                            introduced through the anus and advanced to the the                            cecum, identified by appendiceal orifice and                            ileocecal valve. The ileocecal valve, appendiceal                            orifice, and rectum were photographed. The quality                            of the bowel preparation was excellent. The                            colonoscopy was performed without difficulty. The                            patient tolerated the procedure well. The bowel                            preparation used was SUPREP via split dose                            instruction. Scope In: 2:03:48 PM Scope Out: 2:16:56 PM Scope Withdrawal Time: 0 hours 9 minutes 53 seconds  Total Procedure Duration: 0 hours 13 minutes 8 seconds  Findings:                 A 4 mm polyp was found in the transverse colon. The                            polyp was removed with a cold snare. Resection and  retrieval were complete.                           Multiple diverticula were found in the sigmoid                            colon.                           The exam was otherwise without abnormality on                            direct and retroflexion views. Complications:            No immediate complications. Estimated blood loss:                            None. Estimated Blood Loss:     Estimated blood loss: none. Impression:               - One 4 mm polyp in the transverse colon, removed                            with a cold snare. Resected and retrieved.                           - Diverticulosis in the sigmoid colon.                           - The examination was otherwise normal on direct                            and retroflexion views. Recommendation:           - Repeat  colonoscopy in 5 years for surveillance.                           - Patient has a contact number available for                            emergencies. The signs and symptoms of potential                            delayed complications were discussed with the                            patient. Return to normal activities tomorrow.                            Written discharge instructions were provided to the                            patient.                           - Resume previous diet.                           -  Continue present medications.                           - Await pathology results. Wilhemina Bonito. Marina Goodell, MD 03/09/2023 2:22:44 PM This report has been signed electronically.

## 2023-03-09 NOTE — Progress Notes (Signed)
HISTORY OF PRESENT ILLNESS:  Lori Barber is a 57 y.o. female with reported history of colon polyps.  Multiple prior examinations with other GI docs including Dr. Jarold Motto, Dr. Leone Payor, myself.  Now for surveillance  REVIEW OF SYSTEMS:  All non-GI ROS negative except for  Past Medical History:  Diagnosis Date   Abdominal pain, epigastric    Allergy    mold,dust, animal dander   Anal fissure    Anemia    Anxiety    Esophageal reflux    Esophageal stricture    Esophagitis, unspecified    Family history of malignant neoplasm of gastrointestinal tract    Fatty liver    Follicular cyst of ovary    Hyperlipidemia    Irritable bowel syndrome    Obesity, unspecified    Personal history of colonic polyps    PONV (postoperative nausea and vomiting)    Type II or unspecified type diabetes mellitus without mention of complication, not stated as uncontrolled    borderline, controlled by diet    Unspecified gastritis and gastroduodenitis without mention of hemorrhage     Past Surgical History:  Procedure Laterality Date   ABDOMINAL HYSTERECTOMY     CESAREAN SECTION  1999   CHOLECYSTECTOMY  2011   COLONOSCOPY     COLONOSCOPY N/A 04/03/2017   Procedure: COLONOSCOPY;  Surgeon: Hilarie Fredrickson, MD;  Location: [facility] ENDOSCOPY;  Service: Endoscopy;  Laterality: N/A;   OVARIAN CYST REMOVAL  2002   TONSILLECTOMY  1994   UPPER GASTROINTESTINAL ENDOSCOPY      Social History Lori Barber  reports that she quit smoking about 5 years ago. Her smoking use included cigarettes. She started smoking about 44 years ago. She has a 13.00 pack-year smoking history. She has never used smokeless tobacco. She reports that she does not drink alcohol and does not use drugs.  family history includes Breast cancer in her mother; Cancer in her cousin, maternal aunt, and mother; Colon cancer in her cousin, maternal aunt, and maternal uncle; Esophageal cancer in her maternal uncle; Heart disease in her paternal  grandmother; Other in her father and mother.  Allergies  Allergen Reactions   Cleocin [Clindamycin Hcl] Anaphylaxis and Shortness Of Breath    Blood pressure dropping   Penicillins Anaphylaxis and Swelling    Has patient had a PCN reaction causing immediate rash, facial/tongue/throat swelling, SOB or lightheadedness with hypotension: Yes Has patient had a PCN reaction causing severe rash involving mucus membranes or skin necrosis:no Has patient had a PCN reaction that required hospitalization: yes Has patient had a PCN reaction occurring within the last 10 years:no If all of the above answers are "NO", then may proceed with Cephalosporin use.    Mushroom Extract Complex Nausea And Vomiting and Other (See Comments)    Headache  Other reaction(s): Other (See Comments) Headache Headache Headache    Amoxicillin Nausea Only   Clarithromycin Nausea And Vomiting       PHYSICAL EXAMINATION: Vital signs: BP 126/78   Pulse 66   Temp (!) 97.3 F (36.3 C) (Temporal)   Resp 10   Ht 5' 3.5" (1.613 m)   Wt 240 lb (108.9 kg)   SpO2 98%   BMI 41.85 kg/m  General: Well-developed, well-nourished, no acute distress HEENT: Sclerae are anicteric, conjunctiva pink. Oral mucosa intact Lungs: Clear Heart: Regular Abdomen: soft, nontender, nondistended, no obvious ascites, no peritoneal signs, normal bowel sounds. No organomegaly. Extremities: No edema Psychiatric: alert and oriented x3. Cooperative  ASSESSMENT:  History of colon polyps   PLAN:   Surveillance colonoscopy

## 2023-03-09 NOTE — Progress Notes (Signed)
Report to PACU, RN, vss, BBS= Clear.  

## 2023-03-09 NOTE — Patient Instructions (Signed)
Await pathology results  Resume previous diet.  Continue present medications.  Handouts on polyps and diverticulosis provided.  YOU HAD AN ENDOSCOPIC PROCEDURE TODAY AT THE [facility]:   Refer to the procedure report that was given to you for any specific questions about what was found during the examination.  If the procedure report does not answer your questions, please call your gastroenterologist to clarify.  If you requested that your care partner not be given the details of your procedure findings, then the procedure report has been included in a sealed envelope for you to review at your convenience later.  YOU SHOULD EXPECT: Some feelings of bloating in the abdomen. Passage of more gas than usual.  Walking can help get rid of the air that was put into your GI tract during the procedure and reduce the bloating. If you had a lower endoscopy (such as a colonoscopy or flexible sigmoidoscopy) you may notice spotting of blood in your stool or on the toilet paper. If you underwent a bowel prep for your procedure, you may not have a normal bowel movement for a few days.  Please Note:  You might notice some irritation and congestion in your nose or some drainage.  This is from the oxygen used during your procedure.  There is no need for concern and it should clear up in a day or so.  SYMPTOMS TO REPORT IMMEDIATELY:  Following lower endoscopy (colonoscopy or flexible sigmoidoscopy):  Excessive amounts of blood in the stool  Significant tenderness or worsening of abdominal pains  Swelling of the abdomen that is new, acute  Fever of 100F or higher  For urgent or emergent issues, a gastroenterologist can be reached at any hour by calling [phone]. Do not use MyChart messaging for urgent concerns.    DIET:  We do recommend a small meal at first, but then you may proceed to your regular diet.  Drink plenty of fluids but you should avoid alcoholic beverages for 24  hours.  ACTIVITY:  You should plan to take it easy for the rest of today and you should NOT DRIVE or use heavy machinery until tomorrow (because of the sedation medicines used during the test).    FOLLOW UP: Our staff will call the number listed on your records the next business day following your procedure.  We will call around 7:15- 8:00 am to check on you and address any questions or concerns that you may have regarding the information given to you following your procedure. If we do not reach you, we will leave a message.     If any biopsies were taken you will be contacted by phone or by letter within the next 1-3 weeks.  Please call us at [phone] if you have not heard about the biopsies in 3 weeks.    SIGNATURES/CONFIDENTIALITY: You and/or your care partner have signed paperwork which will be entered into your electronic medical record.  These signatures attest to the fact that that the information above on your After Visit Summary has been reviewed and is understood.  Full responsibility of the confidentiality of this discharge information lies with you and/or your care-partner.

## 2023-03-09 NOTE — Progress Notes (Signed)
Called to room to assist during endoscopic procedure.  Patient ID and intended procedure confirmed with present staff. Received instructions for my participation in the procedure from the performing physician.  

## 2023-03-10 ENCOUNTER — Telehealth: Payer: Self-pay

## 2023-03-10 NOTE — Telephone Encounter (Signed)
  Follow up Call-     03/09/2023    1:17 PM  Call back number  Post procedure Call Back phone  # [phone]  Permission to leave phone message Yes     Patient questions:  Do you have a fever, pain , or abdominal swelling? No. Pain Score  0 *  Have you tolerated food without any problems? Yes.    Have you been able to return to your normal activities? Yes.    Do you have any questions about your discharge instructions: Diet   No. Medications  No. Follow up visit  No.  Do you have questions or concerns about your Care? No.  Actions: * If pain score is 4 or above: No action needed, pain <4.

## 2023-03-12 ENCOUNTER — Encounter: Payer: Self-pay | Admitting: Internal Medicine

## 2023-06-25 ENCOUNTER — Other Ambulatory Visit: Payer: Self-pay | Admitting: Obstetrics and Gynecology

## 2023-06-25 DIAGNOSIS — Z803 Family history of malignant neoplasm of breast: Secondary | ICD-10-CM

## 2023-06-26 ENCOUNTER — Other Ambulatory Visit: Payer: Self-pay | Admitting: Obstetrics and Gynecology

## 2023-06-26 DIAGNOSIS — Z87891 Personal history of nicotine dependence: Secondary | ICD-10-CM

## 2023-08-18 ENCOUNTER — Ambulatory Visit
Admission: RE | Admit: 2023-08-18 | Discharge: 2023-08-18 | Disposition: A | Discharge status: Home / Self Care | Payer: 59 | Source: Ambulatory Visit | Attending: Obstetrics and Gynecology | Admitting: Obstetrics and Gynecology

## 2023-08-18 DIAGNOSIS — Z803 Family history of malignant neoplasm of breast: Secondary | ICD-10-CM

## 2023-08-18 MED ORDER — GADOPICLENOL 0.5 MMOL/ML IV SOLN
10.0000 mL | Freq: Once | INTRAVENOUS | Status: AC | PRN
  Administered 2023-08-18: 10 mL via INTRAVENOUS

## 2023-11-11 ENCOUNTER — Ambulatory Visit (INDEPENDENT_AMBULATORY_CARE_PROVIDER_SITE_OTHER): Payer: 59 | Admitting: Physician Assistant

## 2023-11-11 ENCOUNTER — Ambulatory Visit: Payer: Self-pay | Admitting: Physician Assistant

## 2023-11-11 ENCOUNTER — Encounter: Payer: Self-pay | Admitting: Physician Assistant

## 2023-11-11 DIAGNOSIS — G5601 Carpal tunnel syndrome, right upper limb: Secondary | ICD-10-CM | POA: Diagnosis not present

## 2023-11-11 MED ORDER — MELOXICAM 15 MG PO TABS: 15.0000 mg | ORAL_TABLET | Freq: Every day | ORAL | 0 refills | Status: AC

## 2023-11-11 NOTE — Progress Notes (Signed)
 Office Visit Note   Patient: Lori Barber           Date of Birth: 04/16/1966           MRN: [id number] Visit Date: 11/11/2023              Requested by: Lori Oneil FALCON, MD [phone] [facility] [address] [facility] [place],  [place] [address] PCP: Lori Oneil FALCON, MD   Assessment & Plan: Visit Diagnoses:  1. Carpal tunnel syndrome, right upper limb     Plan: Lori Barber is a pleasant 58 year old woman who presents today with a 2-week history of right thumb and index finger numbness.  She is left-hand dominant.  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  She denies any particular injury or any previous history of this.  She has a positive Tinel test but a negative Phalen's test.  Certainly the pattern of her numbness would coordinate with carpal tunnel distribution.  She describes that it goes sometimes upper to her elbow into her shoulder however no neck symptoms no particular isolated shoulder pain.  Since this has been only going on a couple weeks of recommended conservative treatment including a removable wrist splint and anti-inflammatories.  She will call me if she does not get better if still significant we could order nerve testing with Dr. Eldonna she understands not to take the Mobic  with other anti-inflammatories and to take it with food  Follow-Up Instructions: Return if symptoms worsen or fail to improve.   Orders:  No orders of the defined types were placed in this encounter.  Meds ordered this encounter  Medications   meloxicam  (MOBIC ) 15 MG tablet    Sig: Take 1 tablet (15 mg total) by mouth daily.    Dispense:  30 tablet    Refill:  0      Procedures: No procedures performed   Clinical Data: No additional findings.   Subjective: Chief Complaint  Patient presents with   Right Hand - Numbness    HPI pleasant 58 year old woman with 2-week history of right hand numbness and tingling most concentrated in the thumb index finger.  Denies any injury  does have a job where she is typing all day.  She is left-hand dominant  Review of Systems  All other systems reviewed and are negative.    Objective: Vital Signs: There were no vitals taken for this visit.  Physical Exam Constitutional:      Appearance: Normal appearance.  Pulmonary:     Effort: Pulmonary effort is normal.  Skin:    General: Skin is warm and dry.  Neurological:     General: No focal deficit present.     Mental Status: She is alert and oriented to person, place, and time.  Psychiatric:        Mood and Affect: Mood normal.        Behavior: Behavior normal.     Ortho Exam Examination examination of her right wrist she has a strong radial pulse brisk capillary refill is able to oppose all of her fingers no tenderness no pain negative Finkelstein's test positive Tinel's test negative Phalen's test.  No tenderness over the medial lateral epicondyles. Specialty Comments:  No specialty comments available.  Imaging: No results found.   PMFS History: Patient Active Problem List   Diagnosis Date Noted   Carpal tunnel syndrome, right upper limb 11/11/2023   Family history of breast cancer 06/04/2021   Claudication (HCC) 10/28/2017  OSA on CPAP 10/14/2017   Hyperlipidemia 06/23/2017   Abnormal CT of the abdomen    Inflammation of colonic mucosa    RLQ abdominal pain 03/31/2017   Family history of coronary artery disease 02/12/2017   Tobacco use 02/12/2017   Tubular adenoma 02/12/2017   Hepatic steatosis 02/12/2017   History of fatty infiltration of liver 03/22/2015   Chest pain 07/24/2013   Diabetes mellitus type 2, diet-controlled (HCC) 07/24/2013   Palpitations 07/24/2013   Duodenitis 09/15/2011   Gastritis 09/15/2011   Nausea alone 09/15/2011   GERD with stricture 09/15/2011   Nausea 09/04/2011   Diarrhea following gastrointestinal surgery 09/04/2011   Intra-abdominal hernia 09/04/2011   Obesity 09/04/2011   S/P cholecystectomy 09/04/2011    History of gastroesophageal reflux (GERD) 09/04/2011   Ventral hernia 07/11/2011   IBS (irritable bowel syndrome) 07/11/2011   GERD (gastroesophageal reflux disease) 07/11/2011   Fatty infiltration of liver 07/11/2011   Bilateral ovarian cysts 07/11/2011   Hx of cholecystectomy 07/11/2011   Ventral hernia 10/12/2009   Pain in joint 03/06/2009   Esophagitis 01/22/2009   Gastritis and gastroduodenitis 12/08/2008   OBESITY, UNSPECIFIED 12/07/2008   ABDOMINAL PAIN-EPIGASTRIC 12/07/2008   Hypothyroidism 11/09/2008   Hemorrhoids 11/09/2008   GERD 11/09/2008   IBS 11/09/2008   FATTY LIVER DISEASE 11/09/2008   OVARIAN CYST 11/09/2008   History of colonic polyps 11/09/2008   Past Medical History:  Diagnosis Date   Abdominal pain, epigastric    Allergy    mold,dust, animal dander   Anal fissure    Anemia    Anxiety    Esophageal reflux    Esophageal stricture    Esophagitis, unspecified    Family history of malignant neoplasm of gastrointestinal tract    Fatty liver    Follicular cyst of ovary    Hyperlipidemia    Irritable bowel syndrome    Obesity, unspecified    Personal history of colonic polyps    PONV (postoperative nausea and vomiting)    Type II or unspecified type diabetes mellitus without mention of complication, not stated as uncontrolled    borderline, controlled by diet    Unspecified gastritis and gastroduodenitis without mention of hemorrhage     Family History  Problem Relation Age of Onset   Breast cancer Mother    Cancer Mother        breast   Other Mother        car accident   Other Father        liver failure   Colon cancer Maternal Aunt        cousin   Cancer Maternal Aunt        colon   Esophageal cancer Maternal Uncle    Colon cancer Maternal Uncle    Heart disease Paternal Grandmother    Cancer Cousin        colon   Colon cancer Cousin    Rectal cancer Neg Hx    Stomach cancer Neg Hx    Colon polyps Neg Hx     Past Surgical History:   Procedure Laterality Date   ABDOMINAL HYSTERECTOMY     CESAREAN SECTION  1999   CHOLECYSTECTOMY  2011   COLONOSCOPY     COLONOSCOPY N/A 04/03/2017   Procedure: COLONOSCOPY;  Surgeon: Abran Norleen SAILOR, MD;  Location: [facility] ENDOSCOPY;  Service: Endoscopy;  Laterality: N/A;   OVARIAN CYST REMOVAL  2002   TONSILLECTOMY  1994   UPPER GASTROINTESTINAL ENDOSCOPY     Social History  Occupational History   Occupation: Geophysical Data Processor: [employer]  Tobacco Use   Smoking status: Former    Current packs/day: 0.00    Average packs/day: 0.5 packs/day for 38.6 years (19.3 ttl pk-yrs)    Types: Cigarettes    Start date: 11/07/1978    Quit date: 06/15/2017    Years since quitting: 6.4   Smokeless tobacco: Never  Vaping Use   Vaping status: Never Used  Substance and Sexual Activity   Alcohol use: No    Alcohol/week: 0.0 standard drinks of alcohol    Comment: occasionally   Drug use: No   Sexual activity: Yes    Birth control/protection: None, Post-menopausal

## 2024-04-14 ENCOUNTER — Other Ambulatory Visit: Payer: Self-pay | Admitting: Family Medicine

## 2024-04-14 DIAGNOSIS — M545 Low back pain, unspecified: Secondary | ICD-10-CM

## 2024-04-17 ENCOUNTER — Inpatient Hospital Stay
Admission: RE | Admit: 2024-04-17 | Discharge: 2024-04-17 | Discharge status: Home / Self Care | Source: Ambulatory Visit | Attending: Family Medicine | Admitting: Family Medicine

## 2024-04-17 DIAGNOSIS — M545 Low back pain, unspecified: Secondary | ICD-10-CM

## 2024-07-21 ENCOUNTER — Other Ambulatory Visit: Payer: Self-pay | Admitting: Obstetrics and Gynecology

## 2024-07-21 DIAGNOSIS — Z87891 Personal history of nicotine dependence: Secondary | ICD-10-CM

## 2024-08-08 ENCOUNTER — Encounter: Payer: Self-pay | Admitting: Radiology

## 2024-08-10 ENCOUNTER — Ambulatory Visit
Admission: RE | Admit: 2024-08-10 | Discharge: 2024-08-10 | Disposition: A | Discharge status: Home / Self Care | Source: Ambulatory Visit | Attending: Obstetrics and Gynecology | Admitting: Obstetrics and Gynecology

## 2024-08-10 DIAGNOSIS — Z87891 Personal history of nicotine dependence: Secondary | ICD-10-CM

## 2024-09-26 ENCOUNTER — Ambulatory Visit
Admission: EM | Admit: 2024-09-26 | Discharge: 2024-09-26 | Disposition: A | Discharge status: Home / Self Care | Attending: Family Medicine | Admitting: Family Medicine

## 2024-09-26 DIAGNOSIS — M545 Low back pain, unspecified: Secondary | ICD-10-CM | POA: Diagnosis not present

## 2024-09-26 LAB — POCT URINE DIPSTICK
Bilirubin, UA: NEGATIVE
Blood, UA: NEGATIVE
Glucose, UA: NEGATIVE mg/dL
Ketones, POC UA: NEGATIVE mg/dL
Leukocytes, UA: NEGATIVE
Nitrite, UA: NEGATIVE
POC PROTEIN,UA: NEGATIVE
Spec Grav, UA: 1.025
Urobilinogen, UA: 0.2 U/dL
pH, UA: 5.5

## 2024-09-26 MED ORDER — TIZANIDINE HCL 4 MG PO TABS: 4.0000 mg | ORAL_TABLET | Freq: Three times a day (TID) | ORAL | 0 refills | Status: AC | PRN

## 2024-09-26 MED ORDER — KETOROLAC TROMETHAMINE 30 MG/ML IJ SOLN
30.0000 mg | Freq: Once | INTRAMUSCULAR | Status: AC
  Administered 2024-09-26: 30 mg via INTRAMUSCULAR

## 2024-09-26 NOTE — ED Triage Notes (Signed)
 Lower back pain worse at night, urinary frequency at night, lower abdominal cramping, nausea, x 1 week.  Taking ibuprofen with no relief of pain.

## 2024-09-26 NOTE — Discharge Instructions (Signed)
 We have given you a shot of Toradol  today which is an anti-inflammatory pain medication that should significantly help your back pain.  I have also prescribed a muscle relaxer to the pharmacy for you to be taking as needed.  Heat, massage, stretches, core strengthening exercises, good posture.  Return for worsening or unresolving symptoms.

## 2024-09-26 NOTE — ED Provider Notes (Signed)
 " [facility]    CSN: [id number] Arrival date & time: 09/26/24  0805      History   Chief Complaint Chief Complaint  Patient presents with   Back Pain    HPI Lori Barber is a 58 y.o. female.   Patient presenting today with 1 week history of significant mid to low back aching bilaterally.  States worse when rolling over at night, moving certain ways.  Denies radiation of pain down legs, weakness, numbness, tingling, bowel or bladder incontinence, saddle anesthesias.  No known injury prior to onset.  Trying ibuprofen with minimal relief.    Past Medical History:  Diagnosis Date   Abdominal pain, epigastric    Allergy    mold,dust, animal dander   Anal fissure    Anemia    Anxiety    Esophageal reflux    Esophageal stricture    Esophagitis, unspecified    Family history of malignant neoplasm of gastrointestinal tract    Fatty liver    Follicular cyst of ovary    Hyperlipidemia    Irritable bowel syndrome    Obesity, unspecified    Personal history of colonic polyps    PONV (postoperative nausea and vomiting)    Type II or unspecified type diabetes mellitus without mention of complication, not stated as uncontrolled    borderline, controlled by diet    Unspecified gastritis and gastroduodenitis without mention of hemorrhage     Patient Active Problem List   Diagnosis Date Noted   Carpal tunnel syndrome, right upper limb 11/11/2023   Family history of breast cancer 06/04/2021   Claudication 10/28/2017   OSA on CPAP 10/14/2017   Hyperlipidemia 06/23/2017   Abnormal CT of the abdomen    Inflammation of colonic mucosa    RLQ abdominal pain 03/31/2017   Family history of coronary artery disease 02/12/2017   Tobacco use 02/12/2017   Tubular adenoma 02/12/2017   Hepatic steatosis 02/12/2017   History of fatty infiltration of liver 03/22/2015   Chest pain 07/24/2013   Diabetes mellitus type 2, diet-controlled (HCC) 07/24/2013   Palpitations  07/24/2013   Duodenitis 09/15/2011   Gastritis 09/15/2011   Nausea alone 09/15/2011   GERD with stricture 09/15/2011   Nausea 09/04/2011   Diarrhea following gastrointestinal surgery 09/04/2011   Intra-abdominal hernia 09/04/2011   Obesity 09/04/2011   S/P cholecystectomy 09/04/2011   History of gastroesophageal reflux (GERD) 09/04/2011   Ventral hernia 07/11/2011   IBS (irritable bowel syndrome) 07/11/2011   GERD (gastroesophageal reflux disease) 07/11/2011   Fatty infiltration of liver 07/11/2011   Bilateral ovarian cysts 07/11/2011   Hx of cholecystectomy 07/11/2011   Ventral hernia 10/12/2009   Pain in joint 03/06/2009   Esophagitis 01/22/2009   Gastritis and gastroduodenitis 12/08/2008   OBESITY, UNSPECIFIED 12/07/2008   ABDOMINAL PAIN-EPIGASTRIC 12/07/2008   Hypothyroidism 11/09/2008   Hemorrhoids 11/09/2008   GERD 11/09/2008   IBS 11/09/2008   FATTY LIVER DISEASE 11/09/2008   OVARIAN CYST 11/09/2008   History of colonic polyps 11/09/2008    Past Surgical History:  Procedure Laterality Date   ABDOMINAL HYSTERECTOMY     CESAREAN SECTION  1999   CHOLECYSTECTOMY  2011   COLONOSCOPY     COLONOSCOPY N/A 04/03/2017   Procedure: COLONOSCOPY;  Surgeon: Abran Norleen LOISE, MD;  Location: [facility] ENDOSCOPY;  Service: Endoscopy;  Laterality: N/A;   OVARIAN CYST REMOVAL  2002   TONSILLECTOMY  1994   UPPER GASTROINTESTINAL ENDOSCOPY      OB History  No obstetric history on file.      Home Medications    Prior to Admission medications  Medication Sig Start Date End Date Taking? Authorizing Provider  escitalopram (LEXAPRO) 10 MG tablet Take 10 mg by mouth daily. 01/21/23  Yes [provider]  tiZANidine  (ZANAFLEX ) 4 MG tablet Take 1 tablet (4 mg total) by mouth every 8 (eight) hours as needed for muscle spasms. Do not drink alcohol or drive while taking this medication.  May cause drowsiness. 09/26/24  Yes Stuart Vernell Norris, PA-C  meloxicam  (MOBIC ) 15 MG tablet Take  1 tablet (15 mg total) by mouth daily. 11/11/23   Persons, Ronal Dragon, PA  sertraline (ZOLOFT) 50 MG tablet Take 50 mg by mouth at bedtime. 06/16/17 04/27/20  [provider]    Family History Family History  Problem Relation Age of Onset   Breast cancer Mother    Cancer Mother        breast   Other Mother        car accident   Other Father        liver failure   Colon cancer Maternal Aunt        cousin   Cancer Maternal Aunt        colon   Esophageal cancer Maternal Uncle    Colon cancer Maternal Uncle    Heart disease Paternal Grandmother    Cancer Cousin        colon   Colon cancer Cousin    Rectal cancer Neg Hx    Stomach cancer Neg Hx    Colon polyps Neg Hx     Social History Social History[1]   Allergies   Cleocin [clindamycin hcl], Penicillins, Mushroom extract complex (obsolete), Amoxicillin, and Clarithromycin   Review of Systems Review of Systems PER HPI  Physical Exam Triage Vital Signs ED Triage Vitals [09/26/24 0840]  Encounter Vitals Group     BP 131/73     Girls Systolic BP Percentile      Girls Diastolic BP Percentile      Boys Systolic BP Percentile      Boys Diastolic BP Percentile      Pulse Rate 73     Resp 16     Temp 97.8 F (36.6 C)     Temp Source Oral     SpO2 95 %     Weight      Height      Head Circumference      Peak Flow      Pain Score 9     Pain Loc      Pain Education      Exclude from Growth Chart    No data found.  Updated Vital Signs BP 131/73 (BP Location: Right Arm)   Pulse 73   Temp 97.8 F (36.6 C) (Oral)   Resp 16   SpO2 95%   Visual Acuity Right Eye Distance:   Left Eye Distance:   Bilateral Distance:    Right Eye Near:   Left Eye Near:    Bilateral Near:     Physical Exam Vitals and nursing note reviewed.  Constitutional:      Appearance: Normal appearance. She is not ill-appearing.  HENT:     Head: Atraumatic.  Eyes:     Extraocular Movements: Extraocular movements intact.      Conjunctiva/sclera: Conjunctivae normal.  Cardiovascular:     Rate and Rhythm: Normal rate.  Pulmonary:     Effort: Pulmonary effort is normal.  Abdominal:     General: Bowel sounds are normal. There is no distension.     Palpations: Abdomen is soft.     Tenderness: There is no abdominal tenderness. There is no right CVA tenderness, left CVA tenderness or guarding.  Musculoskeletal:        General: Tenderness present. No swelling or signs of injury. Normal range of motion.     Cervical back: Normal range of motion and neck supple.     Comments: Tenderness to palpation to bilateral lumbar paraspinal muscles  Skin:    General: Skin is warm and dry.     Findings: No bruising, erythema or rash.  Neurological:     Mental Status: She is alert and oriented to person, place, and time.     Comments: B/l LEs neurovascularly intact  Psychiatric:        Mood and Affect: Mood normal.        Thought Content: Thought content normal.        Judgment: Judgment normal.     UC Treatments / Results  Labs (all labs ordered are listed, but only abnormal results are displayed) Labs Reviewed  POCT URINE DIPSTICK    EKG   Radiology No results found.  Procedures Procedures (including critical care time)  Medications Ordered in UC Medications  ketorolac  (TORADOL ) 30 MG/ML injection 30 mg (30 mg Intramuscular Given 09/26/24 0931)    Initial Impression / Assessment and Plan / UC Course  I have reviewed the triage vital signs and the nursing notes.  Pertinent labs & imaging results that were available during my care of the patient were reviewed by me and considered in my medical decision making (see chart for details).     Urinalysis today without abnormality, vitals and exam reassuring.  Consistent with lumbar strain.  Treat with Zanaflex , IM Toradol , supportive over-the-counter medications and home care.  Return for worsening or unresolving symptoms.  Final Clinical Impressions(s) / UC  Diagnoses   Final diagnoses:  Lumbar pain     Discharge Instructions      We have given you a shot of Toradol  today which is an anti-inflammatory pain medication that should significantly help your back pain.  I have also prescribed a muscle relaxer to the pharmacy for you to be taking as needed.  Heat, massage, stretches, core strengthening exercises, good posture.  Return for worsening or unresolving symptoms.    ED Prescriptions     Medication Sig Dispense Auth. Provider   tiZANidine  (ZANAFLEX ) 4 MG tablet Take 1 tablet (4 mg total) by mouth every 8 (eight) hours as needed for muscle spasms. Do not drink alcohol or drive while taking this medication.  May cause drowsiness. 15 tablet Stuart Vernell Norris, [place]      PDMP not reviewed this encounter.     [1]  Social History Tobacco Use   Smoking status: Former    Current packs/day: 0.00    Average packs/day: 0.5 packs/day for 38.6 years (19.3 ttl pk-yrs)    Types: Cigarettes    Start date: 11/07/1978    Quit date: 06/15/2017    Years since quitting: 7.2   Smokeless tobacco: Never  Vaping Use   Vaping status: Never Used  Substance Use Topics   Alcohol use: No    Alcohol/week: 0.0 standard drinks of alcohol    Comment: occasionally   Drug use: No     Stuart Vernell Norris, PA-C 09/26/24 1801  "
